# Patient Record
Sex: Male | Born: 1935 | Hispanic: No | Smoking: Never smoker
Health system: Southern US, Community
[De-identification: ages and names within clinical notes are randomized; demographics above are authoritative.]

## PROBLEM LIST (undated history)

## (undated) DIAGNOSIS — Z951 Presence of aortocoronary bypass graft: Secondary | ICD-10-CM

## (undated) DIAGNOSIS — N289 Disorder of kidney and ureter, unspecified: Secondary | ICD-10-CM

## (undated) DIAGNOSIS — I219 Acute myocardial infarction, unspecified: Secondary | ICD-10-CM

## (undated) DIAGNOSIS — E119 Type 2 diabetes mellitus without complications: Secondary | ICD-10-CM

## (undated) HISTORY — PX: APPENDECTOMY: SHX54

## (undated) HISTORY — PX: CORONARY ARTERY BYPASS GRAFT: SHX141

## (undated) HISTORY — PX: TOTAL HIP ARTHROPLASTY: SHX124

---

## 2016-01-28 ENCOUNTER — Encounter: Payer: Self-pay | Admitting: Oncology

## 2016-01-28 ENCOUNTER — Other Ambulatory Visit: Payer: Medicare Other

## 2016-02-21 ENCOUNTER — Telehealth: Payer: Self-pay | Admitting: Genetic Counselor

## 2016-02-21 NOTE — Telephone Encounter (Signed)
[name] gives his permission to mail his genetic test result from the family studies we ordered to his daughter.  I will mail him a copy as well.  Briefly discussed results and what they mean.  His daughter Colton Dunn had already called an talked to him about this information.  He knows he is always welcome to call with any questions.

## 2018-06-07 ENCOUNTER — Inpatient Hospital Stay (HOSPITAL_BASED_OUTPATIENT_CLINIC_OR_DEPARTMENT_OTHER)
Admission: EM | Admit: 2018-06-07 | Discharge: 2018-06-10 | DRG: 193 | Disposition: A | Discharge status: Home Health | Payer: Medicare Other | Attending: Internal Medicine | Admitting: Internal Medicine

## 2018-06-07 ENCOUNTER — Other Ambulatory Visit: Payer: Self-pay

## 2018-06-07 ENCOUNTER — Emergency Department (HOSPITAL_BASED_OUTPATIENT_CLINIC_OR_DEPARTMENT_OTHER): Payer: Medicare Other

## 2018-06-07 ENCOUNTER — Encounter (HOSPITAL_BASED_OUTPATIENT_CLINIC_OR_DEPARTMENT_OTHER): Payer: Self-pay

## 2018-06-07 DIAGNOSIS — R296 Repeated falls: Secondary | ICD-10-CM | POA: Diagnosis present

## 2018-06-07 DIAGNOSIS — Z96649 Presence of unspecified artificial hip joint: Secondary | ICD-10-CM | POA: Diagnosis present

## 2018-06-07 DIAGNOSIS — F419 Anxiety disorder, unspecified: Secondary | ICD-10-CM | POA: Diagnosis present

## 2018-06-07 DIAGNOSIS — I2510 Atherosclerotic heart disease of native coronary artery without angina pectoris: Secondary | ICD-10-CM | POA: Diagnosis not present

## 2018-06-07 DIAGNOSIS — Z7982 Long term (current) use of aspirin: Secondary | ICD-10-CM | POA: Diagnosis not present

## 2018-06-07 DIAGNOSIS — E119 Type 2 diabetes mellitus without complications: Secondary | ICD-10-CM | POA: Diagnosis present

## 2018-06-07 DIAGNOSIS — D509 Iron deficiency anemia, unspecified: Secondary | ICD-10-CM | POA: Diagnosis present

## 2018-06-07 DIAGNOSIS — W19XXXA Unspecified fall, initial encounter: Secondary | ICD-10-CM | POA: Diagnosis not present

## 2018-06-07 DIAGNOSIS — J9602 Acute respiratory failure with hypercapnia: Secondary | ICD-10-CM | POA: Diagnosis present

## 2018-06-07 DIAGNOSIS — E1159 Type 2 diabetes mellitus with other circulatory complications: Secondary | ICD-10-CM | POA: Diagnosis not present

## 2018-06-07 DIAGNOSIS — F329 Major depressive disorder, single episode, unspecified: Secondary | ICD-10-CM | POA: Diagnosis present

## 2018-06-07 DIAGNOSIS — Z23 Encounter for immunization: Secondary | ICD-10-CM

## 2018-06-07 DIAGNOSIS — Z951 Presence of aortocoronary bypass graft: Secondary | ICD-10-CM | POA: Diagnosis not present

## 2018-06-07 DIAGNOSIS — J189 Pneumonia, unspecified organism: Secondary | ICD-10-CM | POA: Diagnosis not present

## 2018-06-07 DIAGNOSIS — Z79899 Other long term (current) drug therapy: Secondary | ICD-10-CM | POA: Diagnosis not present

## 2018-06-07 DIAGNOSIS — I252 Old myocardial infarction: Secondary | ICD-10-CM | POA: Diagnosis not present

## 2018-06-07 DIAGNOSIS — Z794 Long term (current) use of insulin: Secondary | ICD-10-CM

## 2018-06-07 DIAGNOSIS — Z8249 Family history of ischemic heart disease and other diseases of the circulatory system: Secondary | ICD-10-CM

## 2018-06-07 DIAGNOSIS — I5032 Chronic diastolic (congestive) heart failure: Secondary | ICD-10-CM | POA: Diagnosis not present

## 2018-06-07 DIAGNOSIS — I25810 Atherosclerosis of coronary artery bypass graft(s) without angina pectoris: Secondary | ICD-10-CM | POA: Diagnosis not present

## 2018-06-07 DIAGNOSIS — J181 Lobar pneumonia, unspecified organism: Secondary | ICD-10-CM | POA: Diagnosis present

## 2018-06-07 DIAGNOSIS — E876 Hypokalemia: Secondary | ICD-10-CM | POA: Diagnosis present

## 2018-06-07 DIAGNOSIS — J9601 Acute respiratory failure with hypoxia: Secondary | ICD-10-CM | POA: Diagnosis not present

## 2018-06-07 DIAGNOSIS — Z87442 Personal history of urinary calculi: Secondary | ICD-10-CM

## 2018-06-07 HISTORY — DX: Acute myocardial infarction, unspecified: I21.9

## 2018-06-07 HISTORY — DX: Disorder of kidney and ureter, unspecified: N28.9

## 2018-06-07 HISTORY — DX: Type 2 diabetes mellitus without complications: E11.9

## 2018-06-07 HISTORY — DX: Presence of aortocoronary bypass graft: Z95.1

## 2018-06-07 LAB — HEPATIC FUNCTION PANEL
ALBUMIN: 3.8 g/dL (ref 3.5–5.0)
ALT: 20 U/L (ref 0–44)
AST: 29 U/L (ref 15–41)
Alkaline Phosphatase: 66 U/L (ref 38–126)
Bilirubin, Direct: 0.2 mg/dL (ref 0.0–0.2)
Indirect Bilirubin: 0.8 mg/dL (ref 0.3–0.9)
TOTAL PROTEIN: 7.7 g/dL (ref 6.5–8.1)
Total Bilirubin: 1 mg/dL (ref 0.3–1.2)

## 2018-06-07 LAB — BASIC METABOLIC PANEL
Anion gap: 10 (ref 5–15)
BUN: 29 mg/dL — ABNORMAL HIGH (ref 8–23)
CO2: 28 mmol/L (ref 22–32)
Calcium: 9.5 mg/dL (ref 8.9–10.3)
Chloride: 97 mmol/L — ABNORMAL LOW (ref 98–111)
Creatinine, Ser: 1.17 mg/dL (ref 0.61–1.24)
GFR calc Af Amer: >60 mL/min (ref >60)
GFR calc non Af Amer: 57 mL/min — ABNORMAL LOW (ref >60)
Glucose, Bld: 136 mg/dL — ABNORMAL HIGH (ref 70–99)
Potassium: 4 mmol/L (ref 3.5–5.1)
Sodium: 135 mmol/L (ref 135–145)

## 2018-06-07 LAB — CBC
HCT: 42.6 % (ref 39.0–52.0)
Hemoglobin: 13.1 g/dL (ref 13.0–17.0)
MCH: 29.4 pg (ref 26.0–34.0)
MCHC: 30.8 g/dL (ref 30.0–36.0)
MCV: 95.7 fL (ref 80.0–100.0)
NRBC: 0 % (ref 0.0–0.2)
Platelets: 206 10*3/uL (ref 150–400)
RBC: 4.45 MIL/uL (ref 4.22–5.81)
RDW: 12.9 % (ref 11.5–15.5)
WBC: 13.8 10*3/uL — ABNORMAL HIGH (ref 4.0–10.5)

## 2018-06-07 LAB — URINALYSIS, ROUTINE W REFLEX MICROSCOPIC
Bilirubin Urine: NEGATIVE
Glucose, UA: 250 mg/dL — AB
KETONES UR: NEGATIVE mg/dL
Leukocytes,Ua: NEGATIVE
Nitrite: NEGATIVE
PH: 6 (ref 5.0–8.0)
Protein, ur: NEGATIVE mg/dL
SPECIFIC GRAVITY, URINE: 1.01 (ref 1.005–1.030)

## 2018-06-07 LAB — LACTIC ACID, PLASMA: Lactic Acid, Venous: 2.1 mmol/L (ref 0.5–1.9)

## 2018-06-07 LAB — URINALYSIS, MICROSCOPIC (REFLEX): SQUAMOUS EPITHELIAL / LPF: NONE SEEN (ref 0–5)

## 2018-06-07 LAB — TROPONIN I: Troponin I: <0.03 ng/mL (ref <0.03)

## 2018-06-07 MED ORDER — AZITHROMYCIN 250 MG PO TABS
1000.0000 mg | ORAL_TABLET | Freq: Once | ORAL | Status: AC
  Administered 2018-06-07: 1000 mg via ORAL
  Filled 2018-06-07: qty 4

## 2018-06-07 MED ORDER — SODIUM CHLORIDE 0.9 % IV SOLN
1.0000 g | Freq: Once | INTRAVENOUS | Status: AC
  Administered 2018-06-07: 1 g via INTRAVENOUS
  Filled 2018-06-07: qty 10

## 2018-06-07 MED ORDER — SODIUM CHLORIDE 0.9 % IV BOLUS
1000.0000 mL | Freq: Once | INTRAVENOUS | Status: AC
  Administered 2018-06-07: 1000 mL via INTRAVENOUS

## 2018-06-07 MED ORDER — ERYTHROMYCIN 5 MG/GM OP OINT
TOPICAL_OINTMENT | Freq: Once | OPHTHALMIC | Status: AC
  Administered 2018-06-07: 23:00:00 via OPHTHALMIC
  Filled 2018-06-07: qty 3.5

## 2018-06-07 NOTE — ED Notes (Signed)
Attempted 106F coude cath- resistance felt. Unable to obtain urine.

## 2018-06-07 NOTE — ED Provider Notes (Signed)
[facility] EMERGENCY DEPARTMENT Provider Note   CSN: [id number] Arrival date & time: 06/07/18  1911    History   Chief Complaint Chief Complaint  Patient presents with  . Fall    HPI Colton Dunn is a 85 y.o. male.     85 yo M with a chief complaint of a fall.  Patient states that he has fallen 3 times this week because he is felt weak and unsteady.  He thinks is due to his cough congestion and fever that has had for about 6 or 7 days.  They called his family doctor today and retry to get an antibiotic called in and they wanted to see him in the office.  He then had a fall.  He denies any injury in the falls.  He denies chest pain.  Has had some shortness of breath and fatigue.  Denies abdominal pain vomiting diarrhea.  He does intermittent catheterizations and felt like his urine has been more cloudy than normal.  The history is provided by the patient.  Fall  This is a new problem. The current episode started 2 days ago. The problem occurs constantly. The problem has not changed since onset.Pertinent negatives include no chest pain, no abdominal pain, no headaches and no shortness of breath. Nothing aggravates the symptoms. Nothing relieves the symptoms. He has tried nothing for the symptoms. The treatment provided no relief.    Past Medical History:  Diagnosis Date  . AMI (acute myocardial infarction) (HCC)   . Diabetes mellitus without complication (HCC)   . Hx of CABG   . Renal disorder    kidney stones    Patient Active Problem List   Diagnosis Date Noted  . CAP (community acquired pneumonia) 06/07/2018    Past Surgical History:  Procedure Laterality Date  . APPENDECTOMY    . CORONARY ARTERY BYPASS GRAFT    . TOTAL HIP ARTHROPLASTY          Home Medications    Prior to Admission medications   Medication Sig Start Date End Date Taking? Authorizing Provider  atenolol (TENORMIN) 25 MG tablet Take 25 mg by mouth. 10/19/17  Yes [provider]  escitalopram (LEXAPRO) 5 MG tablet Take by mouth. 03/01/18  Yes [provider]  ferrous sulfate 325 (65 FE) MG tablet TAKE 1 TABLET BY MOUTH ONCE DAILY WITH  BREAKFAST 11/21/17  Yes [provider]  metFORMIN (GLUCOPHAGE) 1000 MG tablet TAKE 1 TABLET BY MOUTH  TWICE A DAY 07/30/17  Yes [provider]  nortriptyline (PAMELOR) 75 MG capsule TAKE 1 CAPSULE BY MOUTH  NIGHTLY 09/29/17  Yes [provider]  pravastatin (PRAVACHOL) 40 MG tablet TAKE 1 TABLET BY MOUTH  NIGHTLY 02/13/18  Yes [provider]  sitaGLIPtin (JANUVIA) 100 MG tablet TAKE 1 TABLET BY MOUTH ONCE DAILY 09/29/17  Yes [provider]  spironolactone (ALDACTONE) 25 MG tablet Take by mouth. 03/15/18  Yes [provider]  torsemide (DEMADEX) 20 MG tablet Take by mouth. 10/20/17  Yes [provider]  aspirin EC 81 MG tablet Take by mouth.    [provider]    Family History No family history on file.  Social History Social History   Tobacco Use  . Smoking status: Never Smoker  . Smokeless tobacco: Never Used  Substance Use Topics  . Alcohol use: Never    Frequency: Never  . Drug use: Never     Allergies   Patient has no known allergies.  Review of Systems Review of Systems  Constitutional: Negative for chills and fever.  HENT: Positive for congestion. Negative for facial swelling.   Eyes: Negative for discharge and visual disturbance.  Respiratory: Positive for cough. Negative for shortness of breath.   Cardiovascular: Negative for chest pain and palpitations.  Gastrointestinal: Negative for abdominal pain, diarrhea and vomiting.  Musculoskeletal: Negative for arthralgias and myalgias.  Skin: Negative for color change and rash.  Neurological: Positive for weakness (generalized). Negative for tremors, syncope and headaches.  Psychiatric/Behavioral: Negative for confusion and dysphoric mood.     Physical Exam Updated  Vital Signs BP (!) 143/57   Pulse 87   Temp 99.3 F (37.4 C) (Oral)   Resp (!) 26   Ht 5\' 11"  (1.803 m)   Wt 90.7 kg   SpO2 98%   BMI 27.89 kg/m   Physical Exam Vitals signs and nursing note reviewed.  Constitutional:      Appearance: He is well-developed.     Comments: Pallor, dry mucous membranes  HENT:     Head: Normocephalic and atraumatic.  Eyes:     Pupils: Pupils are equal, round, and reactive to light.  Neck:     Musculoskeletal: Normal range of motion and neck supple.     Vascular: No JVD.  Cardiovascular:     Rate and Rhythm: Normal rate and regular rhythm.     Heart sounds: No murmur. No friction rub. No gallop.   Pulmonary:     Effort: No respiratory distress.     Breath sounds: No wheezing.     Comments: Rhonchi to the right lower lobe.  Diminished breath sounds to the entire left chest wall. Abdominal:     General: There is no distension.     Tenderness: There is no guarding or rebound.  Musculoskeletal: Normal range of motion.  Skin:    Coloration: Skin is not pale.     Findings: No rash.  Neurological:     Mental Status: He is alert and oriented to person, place, and time.  Psychiatric:        Behavior: Behavior normal.      ED Treatments / Results  Labs (all labs ordered are listed, but only abnormal results are displayed) Labs Reviewed  URINALYSIS, ROUTINE W REFLEX MICROSCOPIC - Abnormal; Notable for the following components:      Result Value   Glucose, UA 250 (*)    Hgb urine dipstick TRACE (*)    All other components within normal limits  BASIC METABOLIC PANEL - Abnormal; Notable for the following components:   Chloride 97 (*)    Glucose, Bld 136 (*)    BUN 29 (*)    GFR calc non Af Amer 57 (*)    All other components within normal limits  CBC - Abnormal; Notable for the following components:   WBC 13.8 (*)    All other components within normal limits  LACTIC ACID, PLASMA - Abnormal; Notable for the following components:   Lactic  Acid, Venous 2.1 (*)    All other components within normal limits  URINALYSIS, MICROSCOPIC (REFLEX) - Abnormal; Notable for the following components:   Bacteria, UA FEW (*)    All other components within normal limits  URINE CULTURE  CULTURE, BLOOD (ROUTINE X 2)  CULTURE, BLOOD (ROUTINE X 2)  TROPONIN I  HEPATIC FUNCTION PANEL  LACTIC ACID, PLASMA    EKG EKG Interpretation  Date/Time:  Friday June 07 2018 19:47:14 EST Ventricular Rate:  81 PR  Interval:    QRS Duration: 112 QT Interval:  321 QTC Calculation: 373 R Axis:   44 Text Interpretation:  Wandering atrial pacemaker Probable left atrial enlargement Borderline intraventricular conduction delay Baseline wander in lead(s) V2 No old tracing to compare Confirmed by Melene Plan [phone]) on 06/07/2018 7:51:41 PM   Radiology Dg Chest 2 View  Result Date: 06/07/2018 CLINICAL DATA:  Productive cough, shortness of breath EXAM: CHEST - 2 VIEW COMPARISON:  None. FINDINGS: Prior CABG. Heart is normal size. Left base atelectasis or infiltrate. Patchy right upper lobe opacity for. No effusions or acute bony abnormality. IMPRESSION: Patchy opacities in the inferior right upper lobe and the left lung base. Cannot exclude early infiltrates/pneumonia. Electronically Signed   By: Charlett Nose M.D.   On: 06/07/2018 21:12    Procedures Procedures (including critical care time)  Medications Ordered in ED Medications  sodium chloride 0.9 % bolus 1,000 mL (1,000 mLs Intravenous New Bag/Given 06/07/18 1957)  cefTRIAXone (ROCEPHIN) 1 g in sodium chloride 0.9 % 100 mL IVPB (1 g Intravenous New Bag/Given 06/07/18 2142)  azithromycin (ZITHROMAX) tablet 1,000 mg (1,000 mg Oral Given 06/07/18 2139)     Initial Impression / Assessment and Plan / ED Course  I have reviewed the triage vital signs and the nursing notes.  Pertinent labs & imaging results that were available during my care of the patient were reviewed by me and considered in my medical  decision making (see chart for details).        85 yo M with a chief complaint of cough and congestion.  This been going on for the past week.  He has been excessively fatigued and has had multiple episodes where he has fallen to the ground because he felt like he lost his balance.  Having some trouble breathing as well.  Was noted to be hypoxic on arrival with an O2 sat in the mid 80s with very minimal exertion.  Improved with oxygen.  I suspect he likely has pneumonia, will obtain lab work chest x-ray urine lactate give a bolus of fluids and reassess.  Patient's lactic acid is mildly elevated at 2.1.  Urine is not infected.  He has a left lower lobe infiltrate on my view of the chest x-ray.  Will cover for community-acquired pneumonia.  As he is requiring oxygen and has not been doing well at home will discuss with admitting team.  The patients results and plan were reviewed and discussed.   Any x-rays performed were independently reviewed by myself.   Differential diagnosis were considered with the presenting HPI.  Medications  sodium chloride 0.9 % bolus 1,000 mL (1,000 mLs Intravenous New Bag/Given 06/07/18 1957)  cefTRIAXone (ROCEPHIN) 1 g in sodium chloride 0.9 % 100 mL IVPB (1 g Intravenous New Bag/Given 06/07/18 2142)  azithromycin (ZITHROMAX) tablet 1,000 mg (1,000 mg Oral Given 06/07/18 2139)    Vitals:   06/07/18 1926 06/07/18 1927 06/07/18 2145  BP: (!) 152/81  (!) 143/57  Pulse: 87  87  Resp: (!) 28  (!) 26  Temp: 99.3 F (37.4 C)    TempSrc: Oral    SpO2: 90%  98%  Weight:  90.7 kg   Height:   (1.803 m)     Final diagnoses:  Community acquired pneumonia of left lower lobe of lung (HCC)    Admission/ observation were discussed with the admitting physician, patient and/or family and they are comfortable with the plan.    Final Clinical Impressions(s) / ED  Diagnoses   Final diagnoses:  Community acquired pneumonia of left lower lobe of lung [facility])    ED  Discharge Orders    None       Melene Plan, DO 06/07/18 2216

## 2018-06-07 NOTE — ED Notes (Signed)
[facility] arrived to transfer pt to [facility] 

## 2018-06-07 NOTE — ED Triage Notes (Signed)
Pt reports a fall today. Pt denies injury from the fall but is concerned because this is the 3rd fall this week. Pt also productive cough. Pt reports a temp TMax of 100. Pt reports cloudy urine when he straight cath himself this AM.

## 2018-06-07 NOTE — ED Notes (Signed)
Not ready, staff @bedside 

## 2018-06-07 NOTE — ED Notes (Signed)
Attempted in/out 63F cath- resistance felt, unable to obtain urine and blood noted when pulled out.

## 2018-06-07 NOTE — ED Notes (Signed)
Patient transported to X-ray 

## 2018-06-07 NOTE — ED Notes (Signed)
MD bedside

## 2018-06-07 NOTE — ED Notes (Signed)
Pt did self cath with 63F. Urine obtained. 700cc output.

## 2018-06-08 ENCOUNTER — Inpatient Hospital Stay (HOSPITAL_COMMUNITY): Payer: Medicare Other

## 2018-06-08 ENCOUNTER — Encounter (HOSPITAL_COMMUNITY): Payer: Self-pay | Admitting: Internal Medicine

## 2018-06-08 DIAGNOSIS — E1159 Type 2 diabetes mellitus with other circulatory complications: Secondary | ICD-10-CM

## 2018-06-08 DIAGNOSIS — I2510 Atherosclerotic heart disease of native coronary artery without angina pectoris: Secondary | ICD-10-CM | POA: Diagnosis present

## 2018-06-08 DIAGNOSIS — J9601 Acute respiratory failure with hypoxia: Secondary | ICD-10-CM | POA: Diagnosis present

## 2018-06-08 DIAGNOSIS — J9602 Acute respiratory failure with hypercapnia: Secondary | ICD-10-CM

## 2018-06-08 DIAGNOSIS — W19XXXA Unspecified fall, initial encounter: Secondary | ICD-10-CM | POA: Diagnosis present

## 2018-06-08 DIAGNOSIS — J189 Pneumonia, unspecified organism: Principal | ICD-10-CM

## 2018-06-08 DIAGNOSIS — J181 Lobar pneumonia, unspecified organism: Secondary | ICD-10-CM

## 2018-06-08 LAB — LACTIC ACID, PLASMA: Lactic Acid, Venous: 0.8 mmol/L (ref 0.5–1.9)

## 2018-06-08 LAB — CBC WITH DIFFERENTIAL/PLATELET
Abs Immature Granulocytes: 0.1 10*3/uL — ABNORMAL HIGH (ref 0.00–0.07)
Basophils Absolute: 0 10*3/uL (ref 0.0–0.1)
Basophils Relative: 0 %
Eosinophils Absolute: 0.1 10*3/uL (ref 0.0–0.5)
Eosinophils Relative: 1 %
HEMATOCRIT: 37.2 % — AB (ref 39.0–52.0)
Hemoglobin: 11.4 g/dL — ABNORMAL LOW (ref 13.0–17.0)
Immature Granulocytes: 1 %
Lymphocytes Relative: 11 %
Lymphs Abs: 1.4 10*3/uL (ref 0.7–4.0)
MCH: 29.8 pg (ref 26.0–34.0)
MCHC: 30.6 g/dL (ref 30.0–36.0)
MCV: 97.4 fL (ref 80.0–100.0)
Monocytes Absolute: 2 10*3/uL — ABNORMAL HIGH (ref 0.1–1.0)
Monocytes Relative: 15 %
NEUTROS ABS: 9.5 10*3/uL — AB (ref 1.7–7.7)
Neutrophils Relative %: 72 %
Platelets: 188 10*3/uL (ref 150–400)
RBC: 3.82 MIL/uL — AB (ref 4.22–5.81)
RDW: 13.1 % (ref 11.5–15.5)
WBC: 13.1 10*3/uL — ABNORMAL HIGH (ref 4.0–10.5)
nRBC: 0 % (ref 0.0–0.2)

## 2018-06-08 LAB — GLUCOSE, CAPILLARY
GLUCOSE-CAPILLARY: 249 mg/dL — AB (ref 70–99)
Glucose-Capillary: 147 mg/dL — ABNORMAL HIGH (ref 70–99)
Glucose-Capillary: 161 mg/dL — ABNORMAL HIGH (ref 70–99)
Glucose-Capillary: 237 mg/dL — ABNORMAL HIGH (ref 70–99)
Glucose-Capillary: 44 mg/dL — CL (ref 70–99)
Glucose-Capillary: 49 mg/dL — ABNORMAL LOW (ref 70–99)
Glucose-Capillary: 61 mg/dL — ABNORMAL LOW (ref 70–99)
Glucose-Capillary: 79 mg/dL (ref 70–99)

## 2018-06-08 LAB — EXPECTORATED SPUTUM ASSESSMENT W GRAM STAIN, RFLX TO RESP C

## 2018-06-08 LAB — RESPIRATORY PANEL BY PCR
Adenovirus: NOT DETECTED
Bordetella pertussis: NOT DETECTED
CORONAVIRUS NL63-RVPPCR: NOT DETECTED
Chlamydophila pneumoniae: NOT DETECTED
Coronavirus 229E: NOT DETECTED
Coronavirus HKU1: NOT DETECTED
Coronavirus OC43: NOT DETECTED
INFLUENZA A-RVPPCR: NOT DETECTED
INFLUENZA B-RVPPCR: NOT DETECTED
Metapneumovirus: NOT DETECTED
Mycoplasma pneumoniae: NOT DETECTED
PARAINFLUENZA VIRUS 4-RVPPCR: NOT DETECTED
Parainfluenza Virus 1: NOT DETECTED
Parainfluenza Virus 2: NOT DETECTED
Parainfluenza Virus 3: NOT DETECTED
Respiratory Syncytial Virus: NOT DETECTED
Rhinovirus / Enterovirus: NOT DETECTED

## 2018-06-08 LAB — COMPREHENSIVE METABOLIC PANEL
ALT: 18 U/L (ref 0–44)
AST: 23 U/L (ref 15–41)
Albumin: 3.3 g/dL — ABNORMAL LOW (ref 3.5–5.0)
Alkaline Phosphatase: 57 U/L (ref 38–126)
Anion gap: 9 (ref 5–15)
BUN: 24 mg/dL — ABNORMAL HIGH (ref 8–23)
CO2: 27 mmol/L (ref 22–32)
Calcium: 8.8 mg/dL — ABNORMAL LOW (ref 8.9–10.3)
Chloride: 101 mmol/L (ref 98–111)
Creatinine, Ser: 0.98 mg/dL (ref 0.61–1.24)
GFR calc non Af Amer: >60 mL/min (ref >60)
Glucose, Bld: 244 mg/dL — ABNORMAL HIGH (ref 70–99)
POTASSIUM: 3.9 mmol/L (ref 3.5–5.1)
Sodium: 137 mmol/L (ref 135–145)
Total Bilirubin: 0.8 mg/dL (ref 0.3–1.2)
Total Protein: 6.8 g/dL (ref 6.5–8.1)

## 2018-06-08 LAB — HEMOGLOBIN A1C
Hgb A1c MFr Bld: 7.1 % — ABNORMAL HIGH (ref 4.8–5.6)
Mean Plasma Glucose: 157.07 mg/dL

## 2018-06-08 LAB — EXPECTORATED SPUTUM ASSESSMENT W REFEX TO RESP CULTURE

## 2018-06-08 LAB — STREP PNEUMONIAE URINARY ANTIGEN: Strep Pneumo Urinary Antigen: NEGATIVE

## 2018-06-08 LAB — HIV ANTIBODY (ROUTINE TESTING W REFLEX): HIV Screen 4th Generation wRfx: NONREACTIVE

## 2018-06-08 LAB — MAGNESIUM: MAGNESIUM: 1.5 mg/dL — AB (ref 1.7–2.4)

## 2018-06-08 LAB — TROPONIN I: Troponin I: 0.03 ng/mL (ref <0.03)

## 2018-06-08 LAB — TSH: TSH: 2.205 u[IU]/mL (ref 0.350–4.500)

## 2018-06-08 MED ORDER — ACETAMINOPHEN 650 MG RE SUPP: 650.0000 mg | Freq: Four times a day (QID) | RECTAL | Status: DC | PRN

## 2018-06-08 MED ORDER — ESCITALOPRAM OXALATE 10 MG PO TABS
5.0000 mg | ORAL_TABLET | Freq: Every day | ORAL | Status: DC
  Administered 2018-06-08 – 2018-06-10 (×3): 5 mg via ORAL
  Filled 2018-06-08 (×3): qty 1

## 2018-06-08 MED ORDER — INSULIN ASPART 100 UNIT/ML ~~LOC~~ SOLN
0.0000 [IU] | Freq: Three times a day (TID) | SUBCUTANEOUS | Status: DC
  Administered 2018-06-08 – 2018-06-09 (×3): 3 [IU] via SUBCUTANEOUS
  Administered 2018-06-09: 2 [IU] via SUBCUTANEOUS
  Administered 2018-06-10: 3 [IU] via SUBCUTANEOUS

## 2018-06-08 MED ORDER — CHLORHEXIDINE GLUCONATE 0.12 % MT SOLN
15.0000 mL | Freq: Two times a day (BID) | OROMUCOSAL | Status: DC
  Administered 2018-06-08 – 2018-06-09 (×5): 15 mL via OROMUCOSAL
  Filled 2018-06-08 (×4): qty 15

## 2018-06-08 MED ORDER — ACETAMINOPHEN 325 MG PO TABS: 650.0000 mg | ORAL_TABLET | Freq: Four times a day (QID) | ORAL | Status: DC | PRN

## 2018-06-08 MED ORDER — PNEUMOCOCCAL VAC POLYVALENT 25 MCG/0.5ML IJ INJ
0.5000 mL | INJECTION | INTRAMUSCULAR | Status: AC
  Administered 2018-06-09: 0.5 mL via INTRAMUSCULAR
  Filled 2018-06-08: qty 0.5

## 2018-06-08 MED ORDER — FERROUS SULFATE 325 (65 FE) MG PO TABS
325.0000 mg | ORAL_TABLET | Freq: Every day | ORAL | Status: DC
  Administered 2018-06-08 – 2018-06-10 (×3): 325 mg via ORAL
  Filled 2018-06-08 (×3): qty 1

## 2018-06-08 MED ORDER — NITROGLYCERIN 0.4 MG SL SUBL: 0.4000 mg | SUBLINGUAL_TABLET | SUBLINGUAL | Status: DC

## 2018-06-08 MED ORDER — ATENOLOL 25 MG PO TABS
25.0000 mg | ORAL_TABLET | Freq: Every day | ORAL | Status: DC
  Administered 2018-06-08 – 2018-06-10 (×3): 25 mg via ORAL
  Filled 2018-06-08 (×3): qty 1

## 2018-06-08 MED ORDER — INSULIN DETEMIR 100 UNIT/ML ~~LOC~~ SOLN
22.0000 [IU] | Freq: Two times a day (BID) | SUBCUTANEOUS | Status: DC
  Administered 2018-06-08: 22 [IU] via SUBCUTANEOUS
  Filled 2018-06-08 (×2): qty 0.22

## 2018-06-08 MED ORDER — NORTRIPTYLINE HCL 25 MG PO CAPS
75.0000 mg | ORAL_CAPSULE | Freq: Every day | ORAL | Status: DC
  Administered 2018-06-08 – 2018-06-09 (×2): 75 mg via ORAL
  Filled 2018-06-08 (×2): qty 3

## 2018-06-08 MED ORDER — NITROGLYCERIN 0.4 MG SL SUBL: 0.4000 mg | SUBLINGUAL_TABLET | SUBLINGUAL | Status: DC | PRN

## 2018-06-08 MED ORDER — INSULIN ASPART 100 UNIT/ML ~~LOC~~ SOLN
20.0000 [IU] | Freq: Three times a day (TID) | SUBCUTANEOUS | Status: DC
  Administered 2018-06-08 (×2): 20 [IU] via SUBCUTANEOUS

## 2018-06-08 MED ORDER — SODIUM CHLORIDE 0.9 % IV SOLN
500.0000 mg | INTRAVENOUS | Status: DC
  Administered 2018-06-08 – 2018-06-09 (×2): 500 mg via INTRAVENOUS
  Filled 2018-06-08 (×3): qty 500

## 2018-06-08 MED ORDER — ONDANSETRON HCL 4 MG/2ML IJ SOLN: 4.0000 mg | Freq: Four times a day (QID) | INTRAMUSCULAR | Status: DC | PRN

## 2018-06-08 MED ORDER — DEXTROSE-NACL 5-0.45 % IV SOLN
INTRAVENOUS | Status: DC
  Administered 2018-06-08 – 2018-06-09 (×2): via INTRAVENOUS

## 2018-06-08 MED ORDER — ORAL CARE MOUTH RINSE
15.0000 mL | Freq: Two times a day (BID) | OROMUCOSAL | Status: DC
  Administered 2018-06-08 – 2018-06-09 (×4): 15 mL via OROMUCOSAL

## 2018-06-08 MED ORDER — TORSEMIDE 20 MG PO TABS
20.0000 mg | ORAL_TABLET | ORAL | Status: DC
  Administered 2018-06-08 – 2018-06-10 (×2): 20 mg via ORAL
  Filled 2018-06-08 (×2): qty 1

## 2018-06-08 MED ORDER — INSULIN DETEMIR 100 UNIT/ML ~~LOC~~ SOLN
15.0000 [IU] | Freq: Two times a day (BID) | SUBCUTANEOUS | Status: DC
  Administered 2018-06-08 – 2018-06-09 (×3): 15 [IU] via SUBCUTANEOUS
  Filled 2018-06-08 (×4): qty 0.15

## 2018-06-08 MED ORDER — PRAVASTATIN SODIUM 40 MG PO TABS
40.0000 mg | ORAL_TABLET | Freq: Every day | ORAL | Status: DC
  Administered 2018-06-08 – 2018-06-09 (×2): 40 mg via ORAL
  Filled 2018-06-08 (×2): qty 1

## 2018-06-08 MED ORDER — ONDANSETRON HCL 4 MG PO TABS: 4.0000 mg | ORAL_TABLET | Freq: Four times a day (QID) | ORAL | Status: DC | PRN

## 2018-06-08 MED ORDER — ERYTHROMYCIN 5 MG/GM OP OINT
TOPICAL_OINTMENT | Freq: Three times a day (TID) | OPHTHALMIC | Status: DC
  Administered 2018-06-08 (×2): via OPHTHALMIC
  Administered 2018-06-08 – 2018-06-09 (×3): 1 via OPHTHALMIC
  Administered 2018-06-09: 06:00:00 via OPHTHALMIC
  Administered 2018-06-10: 1 via OPHTHALMIC
  Administered 2018-06-10: 15:00:00 via OPHTHALMIC
  Filled 2018-06-08: qty 1

## 2018-06-08 MED ORDER — SPIRONOLACTONE 25 MG PO TABS
25.0000 mg | ORAL_TABLET | ORAL | Status: DC
  Administered 2018-06-08 – 2018-06-10 (×2): 25 mg via ORAL
  Filled 2018-06-08 (×3): qty 1

## 2018-06-08 MED ORDER — SODIUM CHLORIDE 0.9 % IV SOLN
1.0000 g | INTRAVENOUS | Status: DC
  Administered 2018-06-08 – 2018-06-09 (×2): 1 g via INTRAVENOUS
  Filled 2018-06-08: qty 10
  Filled 2018-06-08 (×2): qty 1

## 2018-06-08 MED ORDER — MAGNESIUM SULFATE 2 GM/50ML IV SOLN
2.0000 g | Freq: Once | INTRAVENOUS | Status: AC
  Administered 2018-06-08: 2 g via INTRAVENOUS
  Filled 2018-06-08: qty 50

## 2018-06-08 MED ORDER — ASPIRIN EC 81 MG PO TBEC
81.0000 mg | DELAYED_RELEASE_TABLET | Freq: Every day | ORAL | Status: DC
  Administered 2018-06-08 – 2018-06-10 (×3): 81 mg via ORAL
  Filled 2018-06-08 (×3): qty 1

## 2018-06-08 MED ORDER — DEXTROSE 50 % IV SOLN
1.0000 | Freq: Once | INTRAVENOUS | Status: DC
  Filled 2018-06-08: qty 50

## 2018-06-08 NOTE — Progress Notes (Signed)
CRITICAL VALUE ALERT  Critical Value:  Troponin 0.03  Date & Time Notified: 06/08/2018 0540  Provider Notified: Rana Snare 0542  Orders Received/Actions taken:  Lenox Ponds, RN

## 2018-06-08 NOTE — Evaluation (Signed)
Physical Therapy Evaluation Patient Details Name: Colton Dunn MRN: [id number] DOB: 1932/10/16 Today's Date: 06/08/2018   History of Present Illness  85 y.o. male with history of CAD status post CABG, diabetes mellitus, diastolic dysfunction, hyperlipidemia admitted from [facility] with  wheezing cough weakness and had a 3 falls in the last 1 week, Dx with CAP  Clinical Impression  Pt admitted with above diagnosis. Pt currently with functional limitations due to the deficits listed below (see PT Problem List). Pt doing well, feeling better today; may benefit from using RW for safety initially; continue HHPT post acute;   Pt will benefit from skilled PT to increase their independence and safety with mobility to allow discharge to the venue listed below.       Follow Up Recommendations Home health PT    Equipment Recommendations  None recommended by PT    Recommendations for Other Services       Precautions / Restrictions Precautions Precautions: Fall      Mobility  Bed Mobility               General bed mobility comments: pt in chair on arrival  Transfers Overall transfer level: Needs assistance Equipment used: Rolling walker (2 wheeled) Transfers: Sit to/from Stand Sit to Stand: Min guard         General transfer comment: for safety  Ambulation/Gait Ambulation/Gait assistance: Min guard;Supervision Gait Distance (Feet): 200 Feet Assistive device: Rolling walker (2 wheeled) Gait Pattern/deviations: Step-to pattern;Step-through pattern;Decreased stride length     General Gait Details: cues for RW safety and trunk extension  Stairs            Wheelchair Mobility    Modified Rankin (Stroke Patients Only)       Balance Overall balance assessment: Needs assistance;History of Falls(reports 3 falls in the last week since being sick)   Sitting balance-Leahy Scale: Good       Standing balance-Leahy Scale: Good(fair to good, NT to mod  challenges) Standing balance comment: pt able to wt shift without UE support and begin self cath in standing--RN present upon PT departure from pt for safety in bathroom                             Pertinent Vitals/Pain Pain Assessment: No/denies pain    Home Living Family/patient expects to be discharged to:: Private residence Living Arrangements: Children Available Help at Discharge: Family Type of Home: House         Home Equipment: Dan Humphreys - 2 wheels Additional Comments: pt has been living with his dtr since Jan 2020, he plans to return to her home or His son's home in [place]    Prior Function Level of Independence: Independent         Comments: reports IND with ADLs, amb without AD      Hand Dominance        Extremity/Trunk Assessment   Upper Extremity Assessment Upper Extremity Assessment: Overall WFL for tasks assessed    Lower Extremity Assessment Lower Extremity Assessment: Generalized weakness       Communication   Communication: No difficulties  Cognition Arousal/Alertness: Awake/alert Behavior During Therapy: WFL for tasks assessed/performed Overall Cognitive Status: Within Functional Limits for tasks assessed  General Comments      Exercises     Assessment/Plan    PT Assessment Patient needs continued PT services  PT Problem List Decreased activity tolerance;Decreased strength;Decreased mobility;Decreased knowledge of use of DME;Decreased balance       PT Treatment Interventions DME instruction;Gait training;Functional mobility training;Therapeutic activities;Patient/family education;Therapeutic exercise    PT Goals (Current goals can be found in the Care Plan section)  Acute Rehab PT Goals Patient Stated Goal: home soon, back to doing therapy PT Goal Formulation: With patient Time For Goal Achievement: 06/22/18 Potential to Achieve Goals: Good    Frequency Min  3X/week   Barriers to discharge        Co-evaluation               AM-PAC PT "6 Clicks" Mobility  Outcome Measure Help needed turning from your back to your side while in a flat bed without using bedrails?: A Little Help needed moving from lying on your back to sitting on the side of a flat bed without using bedrails?: A Little Help needed moving to and from a bed to a chair (including a wheelchair)?: A Little Help needed standing up from a chair using your arms (e.g., wheelchair or bedside chair)?: A Little Help needed to walk in hospital room?: A Little Help needed climbing 3-5 steps with a railing? : A Little 6 Click Score: 18    End of Session Equipment Utilized During Treatment: Gait belt Activity Tolerance: Patient tolerated treatment well Patient left: with call bell/phone within reach;Other (comment)   PT Visit Diagnosis: Unsteadiness on feet (R26.81)    Time: 9191-6606 PT Time Calculation (min) (ACUTE ONLY): 13 min   Charges:   PT Evaluation $PT Eval Low Complexity: 1 Low          Drucilla Chalet, PT  Pager: [phone] Acute Rehab Dept [facility]): [phone]   06/08/2018   [facility] 06/08/2018, 1:17 PM

## 2018-06-08 NOTE — Progress Notes (Signed)
PROGRESS NOTE    Colton KannerWillie Pennix  AVW:[id number] DOB: [birth year] DOA: 06/07/2018 PCP: Sherrilyn RistFakadej, Maria Margaret, MD   Chief complaint: Shortness of breath, cough, fall   Brief Narrative:   Colton Dunn is a 85 y.o. male with history of CAD status post CABG, diabetes mellitus, diastolic dysfunction, hyperlipidemia presents to the ER admits in [facility] with complaints of having wheezing cough weakness and had a 3 falls in the last 1 week.  Patient states since Monday, June 03, 2018 patient started having wheezing and coughing productive of sputum.  Since then patient started feeling weak and had 3 falls did not lose consciousness.  Has no chest pain.  Since patient weakness and cough persisted patient came to the ER at [facility].  ED Course: EKG was showing wandering pacemaker.  Labs showed leukocytosis of 13.8 lactate is mildly elevated.  Was given fluid bolus.  Chest x-ray shows infiltrates concerning for pneumonia and patient's clinical picture also fits with it.  Patient admitted for pneumonia and also will need further observation for his recurrent falls.  Patient was started on ceftriaxone and Zithromax.   Assessment & Plan:   Principal Problem:   CAP (community acquired pneumonia) Active Problems:   Falls   CAD (coronary artery disease)   Type 2 diabetes mellitus with vascular disease (HCC)   Acute hypoxic respiratory failure Pneumonia, suspect gram-negative organism Patient presenting with shortness of breath, productive cough.  Noted to have an elevated white blood cell count on admission of 13.1 with a lactic acid of 2.1.  Chest x-ray notable for patchy infiltrates right upper lobe and left lung base.  Requiring supplemental oxygen to maintain adequate SPO2.  Not on home O2. --Respiratory viral panel, urine Legionella/strep antigen, blood cx x2, sputum cx, urine cx pending --Continue antibiotics with azithromycin and ceftriaxone --Continue to titrate  supplemental oxygen to maintain SPO2 greater than 92% --Supportive care  Recurrent falls Patient currently resides alone.  Reports 3 falls without syncope over the past week.  He plans to eventually move in with either his son or daughter.  CT head unremarkable. --Continue to monitor on telemetry --Orthostatics pending --PT/OT evaluation --May require placement if unable to move in with son/daughter following this hospitalization  CAD s/p CABG Chronic congestive heart failure with diastolic dysfunction Previous echocardiogram October 2019 notable for EF of 55-60% with diastolic dysfunction. --Continue atenolol 25 mg p.o. daily, spironolactone 25 mg QOD, torsemide 20 mg QOD --Continue statin and aspirin  Type 2 diabetes mellitus Home regimen includes sitagliptin 100 mg PO QOD, metformin 1000 mg, Levemir 22u [place] BID, Humalog 26u TIDAC.  Last hemoglobin A1c 6.3 on 02/22/2018. --Repeat hemoglobin A1c --Hold oral hypoglycemics --Continue Levemir 22 units subcutaneously twice daily --Reduce Humalog to 20 units 3 times daily AC with insulin sliding scale for further coverage --To monitor glucose closely especially in the setting of active infection.  Anxiety/depression --Continue home Lexapro 5 mg p.o. daily --Continue home nortriptyline 75 mg qHS  Iron deficiency anemia --Continue home ferrous sulfate 325 mg p.o. daily  DVT prophylaxis: SCDs Code Status: Full code Family Communication: None Disposition Plan: Continue inpatient hospitalization, need to further titrate supplemental oxygen, PT evaluation for discharge needs, further depending on clinical course.   Consultants:   None  Procedures:   None  Antimicrobials:   Azithromycin 2/29>>>  Ceftriaxone 2/29>>>   Subjective: Patient seen and examined at bedside, resting comfortably in bed.  Continues with productive cough and significant shortness of breath.  Reports multiple  falls over the last week, denies any loss of  consciousness/syncope.  Lives alone, possible plan to move with his son or daughter in the future.  No other complaints at this time.  Denies headache, no fever/chills/night sweats, no nausea/vomiting/diarrhea, no chest pain, no palpitations, no abdominal pain, no congestion, no sore throat, no issues with bowel/bladder function, no paresthesias.  No acute events overnight per nursing staff.  Objective: Vitals:   06/08/18 0001 06/08/18 0008 06/08/18 0445 06/08/18 0614  BP:  (!) 154/65  133/61  Pulse:  95  100  Resp:  (!) 24  18  Temp: (!) 97.3 F (36.3 C) (!) 97.3 F (36.3 C)  98 F (36.7 C)  TempSrc: Oral Oral  Oral  SpO2:  95%  93%  Weight: 85.6 kg  86.3 kg   Height: 5\' 11"  (1.803 m)       Intake/Output Summary (Last 24 hours) at 06/08/2018 0940 Last data filed at 06/08/2018 0400 Gross per 24 hour  Intake 1100 ml  Output 625 ml  Net 475 ml   Filed Weights   06/07/18 1927 06/08/18 0001 06/08/18 0445  Weight: 90.7 kg 85.6 kg 86.3 kg    Examination:  General exam: Appears calm and comfortable  Respiratory system: Coarse breath sounds bilaterally with mild rhonchi left base, normal respiratory effort, on 2 L nasal cannula (not on home O2) Cardiovascular system: S1 & S2 heard, RRR. No JVD, murmurs, rubs, gallops or clicks. No pedal edema. Gastrointestinal system: Abdomen is nondistended, soft and nontender. No organomegaly or masses felt. Normal bowel sounds heard. Central nervous system: Alert and oriented. No focal neurological deficits. Extremities: Symmetric 5 x 5 power. Skin: No rashes, lesions or ulcers Psychiatry: Judgement and insight appear normal. Mood & affect appropriate.     Data Reviewed: I have personally reviewed following labs and imaging studies  CBC: Recent Labs  Lab 06/07/18 1947 06/08/18 0307  WBC 13.8* 13.1*  NEUTROABS  --  9.5*  HGB 13.1 11.4*  HCT 42.6 37.2*  MCV 95.7 97.4  PLT 206 188   Basic Metabolic Panel: Recent Labs  Lab  06/07/18 1947 06/08/18 0307  NA 135 137  K 4.0 3.9  CL 97* 101  CO2 28 27  GLUCOSE 136* 244*  BUN 29* 24*  CREATININE 1.17 0.98  CALCIUM 9.5 8.8*  MG  --  1.5*   GFR: Estimated Creatinine Clearance: 60.8 mL/min (by C-G formula based on SCr of 0.98 mg/dL). Liver Function Tests: Recent Labs  Lab 06/07/18 1947 06/08/18 0307  AST 29 23  ALT 20 18  ALKPHOS 66 57  BILITOT 1.0 0.8  PROT 7.7 6.8  ALBUMIN 3.8 3.3*   No results for input(s): LIPASE, AMYLASE in the last 168 hours. No results for input(s): AMMONIA in the last 168 hours. Coagulation Profile: No results for input(s): INR, PROTIME in the last 168 hours. Cardiac Enzymes: Recent Labs  Lab 06/07/18 1947 06/08/18 0307  TROPONINI <0.03 0.03*   BNP (last 3 results) No results for input(s): PROBNP in the last 8760 hours. HbA1C: No results for input(s): HGBA1C in the last 72 hours. CBG: Recent Labs  Lab 06/08/18 0018 06/08/18 0811  GLUCAP 161* 237*   Lipid Profile: No results for input(s): CHOL, HDL, LDLCALC, TRIG, CHOLHDL, LDLDIRECT in the last 72 hours. Thyroid Function Tests: Recent Labs    06/08/18 0307  TSH 2.205   Anemia Panel: No results for input(s): VITAMINB12, FOLATE, FERRITIN, TIBC, IRON, RETICCTPCT in the last 72 hours. Sepsis Labs:  Recent Labs  Lab 06/07/18 1947 06/08/18 0718  LATICACIDVEN 2.1* 0.8    No results found for this or any previous visit (from the past 240 hour(s)).       Radiology Studies: Dg Chest 2 View  Result Date: 06/07/2018 CLINICAL DATA:  Productive cough, shortness of breath EXAM: CHEST - 2 VIEW COMPARISON:  None. FINDINGS: Prior CABG. Heart is normal size. Left base atelectasis or infiltrate. Patchy right upper lobe opacity for. No effusions or acute bony abnormality. IMPRESSION: Patchy opacities in the inferior right upper lobe and the left lung base. Cannot exclude early infiltrates/pneumonia. Electronically Signed   By: Charlett Nose M.D.   On: 06/07/2018 21:12    Ct Head Wo Contrast  Result Date: 06/08/2018 CLINICAL DATA:  Initial evaluation for acute altered mental status. EXAM: CT HEAD WITHOUT CONTRAST TECHNIQUE: Contiguous axial images were obtained from the base of the skull through the vertex without intravenous contrast. COMPARISON:  None. FINDINGS: Brain: Examination degraded by motion artifact. Age-related cerebral atrophy with mild chronic small vessel ischemic disease. No acute intracranial hemorrhage. No acute large vessel territory infarct. No mass lesion, midline shift or mass effect. No hydrocephalus. No extra-axial fluid collection. Vascular: No hyperdense vessel. Scattered vascular calcifications noted within the carotid siphons. Skull: Scalp soft tissues demonstrate no acute finding. Calvarium intact. Sinuses/Orbits: Globes and orbital soft tissues within normal limits. Mucosal thickening present within the ethmoidal air cells and maxillary sinuses, chronic in appearance. No mastoid effusion. Other: None. IMPRESSION: 1. No acute intracranial abnormality. 2. Age-related cerebral atrophy with mild chronic small vessel ischemic disease. Electronically Signed   By: Rise Mu M.D.   On: 06/08/2018 04:51        Scheduled Meds: . aspirin EC  81 mg Oral Daily  . atenolol  25 mg Oral Daily  . chlorhexidine  15 mL Mouth Rinse BID  . erythromycin   Right Eye Q8H  . escitalopram  5 mg Oral Daily  . ferrous sulfate  325 mg Oral Q breakfast  . insulin aspart  0-9 Units Subcutaneous TID WC  . insulin aspart  20 Units Subcutaneous TID WC  . insulin detemir  22 Units Subcutaneous BID  . mouth rinse  15 mL Mouth Rinse q12n4p  . nortriptyline  75 mg Oral QHS  . [START ON 06/09/2018] pneumococcal 23 valent vaccine  0.5 mL Intramuscular Tomorrow-1000  . pravastatin  40 mg Oral q1800  . spironolactone  25 mg Oral QODAY  . torsemide  20 mg Oral QODAY   Continuous Infusions: . azithromycin    . cefTRIAXone (ROCEPHIN)  IV       LOS: 1 day     Time spent: 31 minutes    Alvira Philips [place], DO [facility] Pager [phone]  If 7PM-7AM, please contact night-coverage [email or web address] Password [facility] 06/08/2018, 9:40 AM

## 2018-06-08 NOTE — Progress Notes (Signed)
Pt. Alert and oriented x 4, no diaphoresis noted, weakness,  pt. states he feels like his blood sugar is low. CBG check=44. Juice given and rechecked at 1545 CBG=49. Pt. talking, remains alert and oriented x 4. Coke Cola given. MD notified and new orders received.  At 1611:  CBG = 61,  At 1636: CBG=79. MD notified and Dextrose 50% not given and pt. started on IV fluids per orders. Family at bedside and dinner served.

## 2018-06-08 NOTE — H&P (Signed)
History and Physical    Colton Dunn HMC:[id number] DOB: September 24, 1932 DOA: 06/07/2018  PCP: Sherrilyn Rist, MD  Patient coming from: Home.  Chief Complaint: Shortness of breath cough and fall.  HPI: Colton Dunn is a 85 y.o. male with history of CAD status post CABG, diabetes mellitus, diastolic dysfunction, hyperlipidemia presents to the ER admits in [facility] with complaints of having wheezing cough weakness and had a 3 falls in the last 1 week.  Patient states since Monday, June 03, 2018 patient started having wheezing and coughing productive of sputum.  Since then patient started feeling weak and had 3 falls did not lose consciousness.  Has no chest pain.  Since patient weakness and cough persisted patient came to the ER at [facility].  ED Course: EKG was showing wandering pacemaker.  Labs showed leukocytosis of 13.8 lactate is mildly elevated.  Was given fluid bolus.  Chest x-ray shows infiltrates concerning for pneumonia and patient's clinical picture also fits with it.  Patient admitted for pneumonia and also will need further observation for his recurrent falls.  Patient was started on ceftriaxone and Zithromax.  Review of Systems: As per HPI, rest all negative.   Past Medical History:  Diagnosis Date  . AMI (acute myocardial infarction) (HCC)   . Diabetes mellitus without complication (HCC)   . Hx of CABG   . Renal disorder    kidney stones    Past Surgical History:  Procedure Laterality Date  . APPENDECTOMY    . CORONARY ARTERY BYPASS GRAFT    . TOTAL HIP ARTHROPLASTY       reports that he has never smoked. He has never used smokeless tobacco. He reports that he does not drink alcohol or use drugs.  No Known Allergies  Family History  Problem Relation Age of Onset  . CAD Father     Prior to Admission medications   Medication Sig Start Date End Date Taking? Authorizing Provider  aspirin EC 81 MG tablet Take by mouth.   Yes [provider]  atenolol (TENORMIN) 25 MG tablet Take 25 mg by mouth. 10/19/17  Yes [provider]  escitalopram (LEXAPRO) 5 MG tablet Take 5 mg by mouth daily.  03/01/18  Yes [provider]  ferrous sulfate 325 (65 FE) MG tablet Take 325 mg by mouth daily with breakfast.  11/21/17  Yes [provider]  Insulin Detemir (LEVEMIR FLEXTOUCH) 100 UNIT/ML Pen Inject 22 Units into the skin 2 (two) times daily. 11/14/17  Yes [provider]  insulin lispro (HUMALOG KWIKPEN) 100 UNIT/ML KwikPen Inject 26 Units into the skin 3 (three) times daily. 09/11/17  Yes [provider]  metFORMIN (GLUCOPHAGE) 1000 MG tablet 1,000 mg.  07/30/17  Yes [provider]  nortriptyline (PAMELOR) 75 MG capsule Take 75 mg by mouth at bedtime.  09/29/17  Yes [provider]  pravastatin (PRAVACHOL) 40 MG tablet Take by mouth daily.  02/13/18  Yes [provider]  PRESCRIPTION MEDICATION Inject 1 application into the muscle every 14 (fourteen) days. Testosterone cypionate 200mg /mL injection.  Inject 200mg  into the shoulder, thigh, or buttocks every 14 days.   Yes [provider]  sitaGLIPtin (JANUVIA) 100 MG tablet Take 100 mg by mouth every other day.  09/29/17  Yes [provider]  spironolactone (ALDACTONE) 25 MG tablet Take 25 mg by mouth every other day.  03/15/18  Yes [provider]  torsemide (DEMADEX) 20 MG tablet Take 20 mg by  mouth every other day.  10/20/17  Yes [provider]  nitroGLYCERIN (NITROSTAT) 0.4 MG SL tablet Place 0.4 mg under the tongue See admin instructions. Dissolve one tablet under the tongue every 5 minutes as needed for chest pain. Do not exceed a total of three doses in 15 minutes. 03/09/18   [provider]    Physical Exam: Vitals:   06/07/18 2145 06/07/18 2230 06/08/18 0001 06/08/18 0008  BP: (!) 143/57 (!) 156/65  (!) 154/65  Pulse: 87 91  95  Resp: (!) 26 (!) 27  (!) 24  Temp:    (!) 97.3 F (36.3 C) (!) 97.3 F (36.3 C)  TempSrc:   Oral Oral  SpO2: 98% 98%  95%  Weight:   85.6 kg   Height:    (1.803 m)       Constitutional: Moderately built and nourished. Vitals:   06/07/18 2145 06/07/18 2230 06/08/18 0001 06/08/18 0008  BP: (!) 143/57 (!) 156/65  (!) 154/65  Pulse: 87 91  95  Resp: (!) 26 (!) 27  (!) 24  Temp:   (!) 97.3 F (36.3 C) (!) 97.3 F (36.3 C)  TempSrc:   Oral Oral  SpO2: 98% 98%  95%  Weight:   85.6 kg   Height:    (1.803 m)    Eyes: Anicteric no pallor. ENMT: No discharge from the ears eyes nose and mouth. Neck: No mass or.  No neck rigidity. Respiratory: No rhonchi or crepitations. Cardiovascular: S1-S2 heard. Abdomen: Soft nontender bowel sounds present. Musculoskeletal: No edema.  No joint effusion. Skin: No rash. Neurologic: Alert awake oriented to time place and person.  Moves all extremities. Psychiatric: Appears normal per normal affect.   Labs on Admission: I have personally reviewed following labs and imaging studies  CBC: Recent Labs  Lab 06/07/18 1947  WBC 13.8*  HGB 13.1  HCT 42.6  MCV 95.7  PLT 206   Basic Metabolic Panel: Recent Labs  Lab 06/07/18 1947  NA 135  K 4.0  CL 97*  CO2 28  GLUCOSE 136*  BUN 29*  CREATININE 1.17  CALCIUM 9.5   GFR: Estimated Creatinine Clearance: 51 mL/min (by C-G formula based on SCr of 1.17 mg/dL). Liver Function Tests: Recent Labs  Lab 06/07/18 1947  AST 29  ALT 20  ALKPHOS 66  BILITOT 1.0  PROT 7.7  ALBUMIN 3.8   No results for input(s): LIPASE, AMYLASE in the last 168 hours. No results for input(s): AMMONIA in the last 168 hours. Coagulation Profile: No results for input(s): INR, PROTIME in the last 168 hours. Cardiac Enzymes: Recent Labs  Lab 06/07/18 1947  TROPONINI <0.03   BNP (last 3 results) No results for input(s): PROBNP in the last 8760 hours. HbA1C: No results for input(s): HGBA1C in the last 72 hours. CBG: Recent Labs    Lab 06/08/18 0018  GLUCAP 161*   Lipid Profile: No results for input(s): CHOL, HDL, LDLCALC, TRIG, CHOLHDL, LDLDIRECT in the last 72 hours. Thyroid Function Tests: No results for input(s): TSH, T4TOTAL, FREET4, T3FREE, THYROIDAB in the last 72 hours. Anemia Panel: No results for input(s): VITAMINB12, FOLATE, FERRITIN, TIBC, IRON, RETICCTPCT in the last 72 hours. Urine analysis:    Component Value Date/Time   COLORURINE YELLOW 06/07/2018 1947   APPEARANCEUR CLEAR 06/07/2018 1947   LABSPEC 1.010 06/07/2018 1947   PHURINE 6.0 06/07/2018 1947   GLUCOSEU 250 (A) 06/07/2018 1947   HGBUR TRACE (A) 06/07/2018 1947   BILIRUBINUR NEGATIVE  06/07/2018 1947   KETONESUR NEGATIVE 06/07/2018 1947   PROTEINUR NEGATIVE 06/07/2018 1947   NITRITE NEGATIVE 06/07/2018 1947   LEUKOCYTESUR NEGATIVE 06/07/2018 1947   Sepsis Labs: @LABRCNTIP (procalcitonin:4,lacticidven:4) )No results found for this or any previous visit (from the past 240 hour(s)).   Radiological Exams on Admission: Dg Chest 2 View  Result Date: 06/07/2018 CLINICAL DATA:  Productive cough, shortness of breath EXAM: CHEST - 2 VIEW COMPARISON:  None. FINDINGS: Prior CABG. Heart is normal size. Left base atelectasis or infiltrate. Patchy right upper lobe opacity for. No effusions or acute bony abnormality. IMPRESSION: Patchy opacities in the inferior right upper lobe and the left lung base. Cannot exclude early infiltrates/pneumonia. Electronically Signed   By: Charlett Nose M.D.   On: 06/07/2018 21:12    EKG: Independently reviewed.  Wandering pacemaker.  Assessment/Plan Principal Problem:   CAP (community acquired pneumonia) Active Problems:   Falls   CAD (coronary artery disease)   Type 2 diabetes mellitus with vascular disease (HCC)    1. Pneumonia -patient symptoms are consistent with community-acquired pneumonia for which patient is placed on ceftriaxone and Zithromax.  Follow sputum cultures influenza PCR cultures, urine for  Legionella strep antigen. 2. Recurrent falls -patient states he falls out of losing balance.  Denies any chest pain or palpitations.  Check orthostatics in the morning.  CT head unremarkable. 3. CAD status post CABG denies any chest pain.  On beta-blockers and statins. 4. Diastolic dysfunction last 2D echo done and October 2019 in care everywhere shows EF of 55 to 60% with diastolic dysfunction on torsemide and spironolactone. 5. Diabetes mellitus type 2 on Levemir and pre-meal NovoLog.  Hold oral hypoglycemics for now.   DVT prophylaxis: Lovenox. Code Status: Full code. Family Communication: Discussed with patient. Disposition Plan: Home. Consults called: None. Admission status: Inpatient.   Eduard Clos MD [facility] Pager [phone].  If 7PM-7AM, please contact night-coverage [email or web address] Password [email or web address]  06/08/2018, 2:54 AM

## 2018-06-09 DIAGNOSIS — E876 Hypokalemia: Secondary | ICD-10-CM | POA: Diagnosis present

## 2018-06-09 LAB — BASIC METABOLIC PANEL
Anion gap: 8 (ref 5–15)
BUN: 20 mg/dL (ref 8–23)
CO2: 29 mmol/L (ref 22–32)
Calcium: 8.3 mg/dL — ABNORMAL LOW (ref 8.9–10.3)
Chloride: 99 mmol/L (ref 98–111)
Creatinine, Ser: 0.94 mg/dL (ref 0.61–1.24)
GFR calc Af Amer: >60 mL/min (ref >60)
GFR calc non Af Amer: >60 mL/min (ref >60)
Glucose, Bld: 177 mg/dL — ABNORMAL HIGH (ref 70–99)
Potassium: 3.3 mmol/L — ABNORMAL LOW (ref 3.5–5.1)
SODIUM: 136 mmol/L (ref 135–145)

## 2018-06-09 LAB — URINE CULTURE: Culture: NO GROWTH

## 2018-06-09 LAB — GLUCOSE, CAPILLARY
GLUCOSE-CAPILLARY: 241 mg/dL — AB (ref 70–99)
Glucose-Capillary: 140 mg/dL — ABNORMAL HIGH (ref 70–99)
Glucose-Capillary: 114 mg/dL — ABNORMAL HIGH (ref 70–99)
Glucose-Capillary: 166 mg/dL — ABNORMAL HIGH (ref 70–99)
Glucose-Capillary: 270 mg/dL — ABNORMAL HIGH (ref 70–99)

## 2018-06-09 LAB — CBC
HEMATOCRIT: 36.6 % — AB (ref 39.0–52.0)
HEMOGLOBIN: 11.1 g/dL — AB (ref 13.0–17.0)
MCH: 29.4 pg (ref 26.0–34.0)
MCHC: 30.3 g/dL (ref 30.0–36.0)
MCV: 96.8 fL (ref 80.0–100.0)
Platelets: 211 10*3/uL (ref 150–400)
RBC: 3.78 MIL/uL — ABNORMAL LOW (ref 4.22–5.81)
RDW: 13.2 % (ref 11.5–15.5)
WBC: 13.2 10*3/uL — ABNORMAL HIGH (ref 4.0–10.5)
nRBC: 0 % (ref 0.0–0.2)

## 2018-06-09 LAB — LEGIONELLA PNEUMOPHILA SEROGP 1 UR AG: L. pneumophila Serogp 1 Ur Ag: NEGATIVE

## 2018-06-09 LAB — MAGNESIUM: Magnesium: 1.6 mg/dL — ABNORMAL LOW (ref 1.7–2.4)

## 2018-06-09 MED ORDER — MAGNESIUM SULFATE 2 GM/50ML IV SOLN
2.0000 g | Freq: Once | INTRAVENOUS | Status: AC
  Administered 2018-06-09: 2 g via INTRAVENOUS
  Filled 2018-06-09: qty 50

## 2018-06-09 MED ORDER — POTASSIUM CHLORIDE CRYS ER 20 MEQ PO TBCR
40.0000 meq | EXTENDED_RELEASE_TABLET | Freq: Once | ORAL | Status: AC
  Administered 2018-06-09: 40 meq via ORAL
  Filled 2018-06-09: qty 2

## 2018-06-09 NOTE — Progress Notes (Addendum)
PROGRESS NOTE    Colton Dunn  [name]:[id number] DOB: August 08, 1932 DOA: 06/07/2018 PCP: Sherrilyn Rist, MD   Chief complaint: Shortness of breath, cough, fall   Brief Narrative:   Colton Dunn is a 85 y.o. male with history of CAD status post CABG, diabetes mellitus, diastolic dysfunction, hyperlipidemia presents to the ER admits in [facility] with complaints of having wheezing cough weakness and had a 3 falls in the last 1 week.  Patient states since Monday, June 03, 2018 patient started having wheezing and coughing productive of sputum.  Since then patient started feeling weak and had 3 falls did not lose consciousness.  Has no chest pain.  Since patient weakness and cough persisted patient came to the ER at [facility].  ED Course: EKG was showing wandering pacemaker.  Labs showed leukocytosis of 13.8 lactate is mildly elevated.  Was given fluid bolus.  Chest x-ray shows infiltrates concerning for pneumonia and patient's clinical picture also fits with it.  Patient admitted for pneumonia and also will need further observation for his recurrent falls.  Patient was started on ceftriaxone and Zithromax.   Assessment & Plan:   Principal Problem:   CAP (community acquired pneumonia) Active Problems:   Falls   CAD (coronary artery disease)   Type 2 diabetes mellitus with vascular disease (HCC)   Acute respiratory failure with hypoxia and hypercapnia (HCC)   Hypokalemia   Hypomagnesemia   Acute hypoxic respiratory failure Pneumonia, suspect gram-negative organism Patient presenting with shortness of breath, productive cough.  Noted to have an elevated white blood cell count on admission of 13.1 with a lactic acid of 2.1.  Chest x-ray notable for patchy infiltrates right upper lobe and left lung base.  Requiring supplemental oxygen to maintain adequate SPO2.  Not on home O2. --Respiratory viral panel negative --Strep pneumo urinary antigen negative --Sputum  culture with few WBCs, rare gram-negative rods, no growth x 12h --Blood cultures x2 no growth x24 hours --urine Legionella antigen pending --Continue antibiotics with azithromycin and ceftriaxone --Continue to titrate supplemental oxygen to maintain SPO2 greater than 92% --6-minute walk test today to evaluate for desaturation on room air --Supportive care  Recurrent falls Patient currently resides alone.  Reports 3 falls without syncope over the past week.  He plans to eventually move in with either his son or daughter.  CT head unremarkable. --Continue to monitor on telemetry --PT recommends home health following discharge with walker --DME orders placed; social work for assistance --Patient currently lives alone, likely will need to move in with 1 of his children versus assisted living situation in the future.  CAD s/p CABG Chronic congestive heart failure with diastolic dysfunction Previous echocardiogram October 2019 notable for EF of 55-60% with diastolic dysfunction. --Continue atenolol 25 mg p.o. daily, spironolactone 25 mg QOD, torsemide 20 mg QOD --Continue statin and aspirin  Type 2 diabetes mellitus Home regimen includes sitagliptin 100 mg PO QOD, metformin 1000 mg, Levemir 22u [place] BID, Humalog 26u TIDAC.  Last hemoglobin A1c 6.3 on 02/22/2018. --Repeat hemoglobin A1c 7.1 on 06/08/2018 --Hold oral hypoglycemics, and mealtime Humalog --Continue Levemir at reduced dose of 15 units [place] BID --insulin sliding scale for further coverage --To monitor glucose closely especially in the setting of active infection.  Anxiety/depression --Continue home Lexapro 5 mg p.o. daily --Continue home nortriptyline 75 mg qHS  Iron deficiency anemia --Continue home ferrous sulfate 325 mg p.o. daily  Hypokalemia Hypomagnesemia K 3.3 and Mg 1.6 today.  Will replete with magnesium  sulfate 2 g IV and K-Dur 40 mEq p.o. --Repeat electrolytes in the a.m. to include magnesium.  DVT prophylaxis:  SCDs Code Status: Full code Family Communication: None Disposition Plan: Continue inpatient hospitalization, need to further titrate supplemental oxygen, likely discharge home with home health services in 1-2 days per physical therapy   Consultants:   None  Procedures:   None  Antimicrobials:   Azithromycin 2/29>>>  Ceftriaxone 2/29>>>   Subjective: Patient seen and examined at bedside, resting comfortably in bed.  Yesterday afternoon, patient with hypoglycemic event.  Insulin regimen was decreased.  Continues with dyspnea and productive cough, although both slightly improved.  Requests further mobilization today with physical therapy around the hallway.  No other complaints this morning.  Denies headache, no fever/chills/night sweats, no nausea/vomiting/diarrhea, no chest pain, no palpitations, no abdominal pain, no congestion, no sore throat, no issues with bowel/bladder function, no paresthesias.  No acute events overnight per nursing staff.  Objective: Vitals:   06/08/18 2048 06/09/18 0430 06/09/18 0500 06/09/18 0636  BP: (!) 111/56 (!) 136/59    Pulse: 76 80  78  Resp: 18 18    Temp: 98.3 F (36.8 C) 98.3 F (36.8 C)    TempSrc: Oral Oral    SpO2: 93% (!) 87%  93%  Weight:   86.8 kg   Height:        Intake/Output Summary (Last 24 hours) at 06/09/2018 0946 Last data filed at 06/09/2018 0449 Gross per 24 hour  Intake 1997.26 ml  Output 1525 ml  Net 472.26 ml   Filed Weights   06/08/18 0001 06/08/18 0445 06/09/18 0500  Weight: 85.6 kg 86.3 kg 86.8 kg    Examination:  General exam: Appears calm and comfortable  Respiratory system: Coarse breath sounds bilaterally with mild rhonchi left base, normal respiratory effort, on 2 L nasal cannula (not on home O2) Cardiovascular system: S1 & S2 heard, RRR. No JVD, murmurs, rubs, gallops or clicks. No pedal edema. Gastrointestinal system: Abdomen is nondistended, soft and nontender. No organomegaly or masses felt. Normal  bowel sounds heard. Central nervous system: Alert and oriented. No focal neurological deficits. Extremities: Symmetric 5 x 5 power. Skin: No rashes, lesions or ulcers Psychiatry: Judgement and insight appear normal. Mood & affect appropriate.     Data Reviewed: I have personally reviewed following labs and imaging studies  CBC: Recent Labs  Lab 06/07/18 1947 06/08/18 0307 06/09/18 0505  WBC 13.8* 13.1* 13.2*  NEUTROABS  --  9.5*  --   HGB 13.1 11.4* 11.1*  HCT 42.6 37.2* 36.6*  MCV 95.7 97.4 96.8  PLT 206 188 211   Basic Metabolic Panel: Recent Labs  Lab 06/07/18 1947 06/08/18 0307 06/09/18 0505  NA 135 137 136  K 4.0 3.9 3.3*  CL 97* 101 99  CO2 GLUCOSE 136* 244* 177*  BUN 29* 24* 20  CREATININE 1.17 0.98 0.94  CALCIUM 9.5 8.8* 8.3*  MG  --  1.5* 1.6*   GFR: Estimated Creatinine Clearance: 63.4 mL/min (by C-G formula based on SCr of 0.94 mg/dL). Liver Function Tests: Recent Labs  Lab 06/07/18 1947 06/08/18 0307  AST 29 23  ALT 20 18  ALKPHOS 66 57  BILITOT 1.0 0.8  PROT 7.7 6.8  ALBUMIN 3.8 3.3*   No results for input(s): LIPASE, AMYLASE in the last 168 hours. No results for input(s): AMMONIA in the last 168 hours. Coagulation Profile: No results for input(s): INR, PROTIME in the last 168 hours. Cardiac  Enzymes: Recent Labs  Lab 06/07/18 1947 06/08/18 0307  TROPONINI <0.03 0.03*   BNP (last 3 results) No results for input(s): PROBNP in the last 8760 hours. HbA1C: Recent Labs    06/08/18 0957  HGBA1C 7.1*   CBG: Recent Labs  Lab 06/08/18 1611 06/08/18 1636 06/08/18 2123 06/09/18 0549 06/09/18 0730  GLUCAP 61* 79 147* 140* 114*   Lipid Profile: No results for input(s): CHOL, HDL, LDLCALC, TRIG, CHOLHDL, LDLDIRECT in the last 72 hours. Thyroid Function Tests: Recent Labs    06/08/18 0307  TSH 2.205   Anemia Panel: No results for input(s): VITAMINB12, FOLATE, FERRITIN, TIBC, IRON, RETICCTPCT in the last 72  hours. Sepsis Labs: Recent Labs  Lab 06/07/18 1947 06/08/18 0718  LATICACIDVEN 2.1* 0.8    Recent Results (from the past 240 hour(s))  Urine C&S     Status: None   Collection Time: 06/07/18  7:47 PM  Result Value Ref Range Status   Specimen Description   Final    URINE, CATHETERIZED Performed at [facility] [place], [address] [place], [place] [address]    Special Requests   Final    NONE Performed at [facility] [place], [address] [place], [place] [address]    Culture   Final    NO GROWTH Performed at [facility], [address] [place], [place] [address]    Report Status 06/09/2018 FINAL  Final  Blood Culture (routine x 2)     Status: None (Preliminary result)   Collection Time: 06/07/18  7:50 PM  Result Value Ref Range Status   Specimen Description   Final    BLOOD BLOOD LEFT FOREARM Performed at [facility] [place], [address] [place], [place] [address]    Special Requests   Final    BOTTLES DRAWN AEROBIC AND ANAEROBIC Blood Culture adequate volume Performed at [facility] [place], [address] [place], [place] [address]    Culture   Final    NO GROWTH < 24 HOURS Performed at [facility], [address] [place], [place] [address]    Report Status PENDING  Incomplete  Blood Culture (routine x 2)     Status: None (Preliminary result)   Collection Time: 06/07/18  7:55 PM  Result Value Ref Range Status   Specimen Description   Final    BLOOD BLOOD RIGHT FOREARM Performed at [facility] [place], [address] [place], [place] [address]    Special Requests   Final    BOTTLES DRAWN AEROBIC AND ANAEROBIC Blood Culture adequate volume Performed at [facility] [place], [address] [place], [place] [address]    Culture   Final    NO GROWTH < 24 HOURS Performed at [facility], [address] [place], [place] [address]    Report Status PENDING  Incomplete  Respiratory Panel by PCR     Status:  None   Collection Time: 06/08/18  1:42 AM  Result Value Ref Range Status   Adenovirus NOT DETECTED NOT DETECTED Final   Coronavirus 229E NOT DETECTED NOT DETECTED Final    Comment: (NOTE) The Coronavirus on the Respiratory Panel, DOES NOT test for the novel  Coronavirus (2019 nCoV)    Coronavirus HKU1 NOT DETECTED NOT DETECTED Final   Coronavirus NL63 NOT DETECTED NOT DETECTED Final   Coronavirus OC43 NOT DETECTED NOT DETECTED Final   Metapneumovirus NOT DETECTED NOT DETECTED  Final   Rhinovirus / Enterovirus NOT DETECTED NOT DETECTED Final   Influenza A NOT DETECTED NOT DETECTED Final   Influenza B NOT DETECTED NOT DETECTED Final   Parainfluenza Virus 1 NOT DETECTED NOT DETECTED Final   Parainfluenza Virus 2 NOT DETECTED NOT DETECTED Final   Parainfluenza Virus 3 NOT DETECTED NOT DETECTED Final   Parainfluenza Virus 4 NOT DETECTED NOT DETECTED Final   Respiratory Syncytial Virus NOT DETECTED NOT DETECTED Final   Bordetella pertussis NOT DETECTED NOT DETECTED Final   Chlamydophila pneumoniae NOT DETECTED NOT DETECTED Final   Mycoplasma pneumoniae NOT DETECTED NOT DETECTED Final    Comment: Performed at [facility], [address] [place], [place] [address]  Culture, sputum-assessment     Status: None   Collection Time: 06/08/18  2:49 AM  Result Value Ref Range Status   Specimen Description SPUTUM  Final   Special Requests NONE  Final   Sputum evaluation   Final    THIS SPECIMEN IS ACCEPTABLE FOR SPUTUM CULTURE Performed at [facility], [address] [place], [place] [address]    Report Status 06/08/2018 FINAL  Final  Culture, respiratory     Status: None (Preliminary result)   Collection Time: 06/08/18  2:49 AM  Result Value Ref Range Status   Specimen Description   Final    SPUTUM Performed at [facility], [address], [place], [place] [address]    Special Requests   Final    NONE Reflexed from [id number] Performed at [facility], [address] [place], [place] [address]    Gram Stain   Final    FEW WBC PRESENT, PREDOMINANTLY PMN RARE GRAM NEGATIVE RODS    Culture   Final    NO GROWTH < 12 HOURS Performed at [facility], [address] [place], [place] [address]    Report Status PENDING  Incomplete         Radiology Studies: Dg Chest 2 View  Result Date: 06/07/2018 CLINICAL DATA:  Productive cough, shortness of breath EXAM: CHEST - 2 VIEW COMPARISON:  None. FINDINGS: Prior CABG. Heart is normal size. Left base atelectasis or infiltrate. Patchy right upper lobe opacity for. No effusions or acute bony abnormality. IMPRESSION: Patchy opacities in the inferior right upper lobe and the left lung base. Cannot exclude early infiltrates/pneumonia. Electronically Signed   By: Charlett Nose M.D.   On: 06/07/2018 21:12   Ct Head Wo Contrast  Result Date: 06/08/2018 CLINICAL DATA:  Initial evaluation for acute altered mental status. EXAM: CT HEAD WITHOUT CONTRAST TECHNIQUE: Contiguous axial images were obtained from the base of the skull through the vertex without intravenous contrast. COMPARISON:  None. FINDINGS: Brain: Examination degraded by motion artifact. Age-related cerebral atrophy with mild chronic small vessel ischemic disease. No acute intracranial hemorrhage. No acute large vessel territory infarct. No mass lesion, midline shift or mass effect. No hydrocephalus. No extra-axial fluid collection. Vascular: No hyperdense vessel. Scattered vascular calcifications noted within the carotid siphons. Skull: Scalp soft tissues demonstrate no acute finding. Calvarium intact. Sinuses/Orbits: Globes and orbital soft tissues within normal limits. Mucosal thickening present within the ethmoidal air cells and maxillary sinuses, chronic in appearance. No mastoid effusion. Other: None. IMPRESSION: 1. No acute intracranial abnormality. 2. Age-related cerebral atrophy with mild chronic small vessel  ischemic disease. Electronically Signed   By: Rise Mu M.D.   On: 06/08/2018 04:51        Scheduled  Meds: . aspirin EC  81 mg Oral Daily  . atenolol  25 mg Oral Daily  . chlorhexidine  15 mL Mouth Rinse BID  . erythromycin   Right Eye Q8H  . escitalopram  5 mg Oral Daily  . ferrous sulfate  325 mg Oral Q breakfast  . insulin aspart  0-9 Units Subcutaneous TID WC  . insulin detemir  15 Units Subcutaneous BID  . mouth rinse  15 mL Mouth Rinse q12n4p  . nortriptyline  75 mg Oral QHS  . pneumococcal 23 valent vaccine  0.5 mL Intramuscular Tomorrow-1000  . pravastatin  40 mg Oral q1800  . spironolactone  25 mg Oral QODAY  . torsemide  20 mg Oral QODAY   Continuous Infusions: . azithromycin 500 mg (06/08/18 2003)  . cefTRIAXone (ROCEPHIN)  IV 1 g (06/08/18 2134)     LOS: 2 days    Time spent: 30 minutes    Alvira Philips [place], DO [facility] Pager [phone]  If 7PM-7AM, please contact night-coverage [email or web address] Password [facility] 06/09/2018, 9:46 AM

## 2018-06-09 NOTE — Progress Notes (Addendum)
SATURATION QUALIFICATIONS: (This note is used to comply with regulatory documentation for home oxygen)  Patient Saturations on Room Air at Rest = 94%  Patient Saturations on Room Air while Ambulating = 80%  Patient Saturations on 2 Liters of oxygen while Ambulating = 92%  Please briefly explain why patient needs home oxygen:  Nery Kalisz, Lavone Orn, RN

## 2018-06-10 DIAGNOSIS — J9602 Acute respiratory failure with hypercapnia: Secondary | ICD-10-CM

## 2018-06-10 DIAGNOSIS — I25810 Atherosclerosis of coronary artery bypass graft(s) without angina pectoris: Secondary | ICD-10-CM

## 2018-06-10 DIAGNOSIS — J9601 Acute respiratory failure with hypoxia: Secondary | ICD-10-CM

## 2018-06-10 LAB — GLUCOSE, CAPILLARY
GLUCOSE-CAPILLARY: 67 mg/dL — AB (ref 70–99)
Glucose-Capillary: 87 mg/dL (ref 70–99)
Glucose-Capillary: 205 mg/dL — ABNORMAL HIGH (ref 70–99)

## 2018-06-10 LAB — CBC
HCT: 39 % (ref 39.0–52.0)
Hemoglobin: 11.6 g/dL — ABNORMAL LOW (ref 13.0–17.0)
MCH: 29.4 pg (ref 26.0–34.0)
MCHC: 29.7 g/dL — ABNORMAL LOW (ref 30.0–36.0)
MCV: 98.7 fL (ref 80.0–100.0)
Platelets: 234 10*3/uL (ref 150–400)
RBC: 3.95 MIL/uL — ABNORMAL LOW (ref 4.22–5.81)
RDW: 13.1 % (ref 11.5–15.5)
WBC: 15.6 10*3/uL — ABNORMAL HIGH (ref 4.0–10.5)
nRBC: 0 % (ref 0.0–0.2)

## 2018-06-10 LAB — BASIC METABOLIC PANEL WITH GFR
Anion gap: 7 (ref 5–15)
BUN: 18 mg/dL (ref 8–23)
CO2: 30 mmol/L (ref 22–32)
Calcium: 8.7 mg/dL — ABNORMAL LOW (ref 8.9–10.3)
Chloride: 101 mmol/L (ref 98–111)
Creatinine, Ser: 0.95 mg/dL (ref 0.61–1.24)
GFR calc Af Amer: >60 mL/min
GFR calc non Af Amer: >60 mL/min
Glucose, Bld: 140 mg/dL — ABNORMAL HIGH (ref 70–99)
Potassium: 3.8 mmol/L (ref 3.5–5.1)
Sodium: 138 mmol/L (ref 135–145)

## 2018-06-10 LAB — MAGNESIUM: Magnesium: 1.9 mg/dL (ref 1.7–2.4)

## 2018-06-10 LAB — CULTURE, RESPIRATORY W GRAM STAIN: Culture: NORMAL

## 2018-06-10 MED ORDER — AZITHROMYCIN 250 MG PO TABS: ORAL_TABLET | ORAL | 0 refills | Status: AC

## 2018-06-10 MED ORDER — INSULIN LISPRO (1 UNIT DIAL) 100 UNIT/ML (KWIKPEN): 6.0000 [IU] | PEN_INJECTOR | Freq: Three times a day (TID) | SUBCUTANEOUS | 0 refills | Status: AC

## 2018-06-10 MED ORDER — INSULIN DETEMIR 100 UNIT/ML FLEXPEN: 15.0000 [IU] | PEN_INJECTOR | Freq: Two times a day (BID) | SUBCUTANEOUS | 0 refills | Status: AC

## 2018-06-10 MED ORDER — INSULIN DETEMIR 100 UNIT/ML ~~LOC~~ SOLN
10.0000 [IU] | Freq: Two times a day (BID) | SUBCUTANEOUS | Status: DC
  Filled 2018-06-10: qty 0.1

## 2018-06-10 MED ORDER — CEFDINIR 300 MG PO CAPS: 300.0000 mg | ORAL_CAPSULE | Freq: Two times a day (BID) | ORAL | 0 refills | Status: AC

## 2018-06-10 NOTE — Progress Notes (Signed)
Spoke with pt concerning HH. Pt states that he is not sure of where he is going. He is in [place] helping his daughter and may go to his son's home in [place]. He has a RW. Pt states right now he did not want home health and asked if he change his mind what to do. Encouraged pt to call his PCP if he changed and want HH.

## 2018-06-10 NOTE — Progress Notes (Signed)
Physical Therapy Treatment Patient Details Name: Colton Dunn MRN: [id number] DOB: 1932/07/22 Today's Date: 06/10/2018    History of Present Illness 85 y.o. male with history of CAD status post CABG, diabetes mellitus, diastolic dysfunction, hyperlipidemia admitted from [facility] with  wheezing cough weakness and had a 3 falls in the last 1 week, Dx with CAP    PT Comments    The patient is found up in room, packing up his things. Patient ambulated x 400' with supervision/min guard. Oxygen saturation on RA 100%. Plans Dc home. Rec. HHPT.  Follow Up Recommendations  Home health PT     Equipment Recommendations  None recommended by PT    Recommendations for Other Services       Precautions / Restrictions Precautions Precautions: Fall    Mobility  Bed Mobility               General bed mobility comments: up in room ad lib  Transfers Overall transfer level: Independent Equipment used: Rolling walker (2 wheeled) Transfers: Sit to/from Stand              Ambulation/Gait Ambulation/Gait assistance: Chief Operating Officer (Feet): 400 Feet Assistive device: Rolling walker (2 wheeled) Gait Pattern/deviations: Step-through pattern Gait velocity: decr   General Gait Details: very slight right lateral balance loss after turning corner   Stairs             Wheelchair Mobility    Modified Rankin (Stroke Patients Only)       Balance                                            Cognition Arousal/Alertness: Awake/alert                                            Exercises      General Comments        Pertinent Vitals/Pain      Home Living                      Prior Function            PT Goals (current goals can now be found in the care plan section) Progress towards PT goals: Progressing toward goals    Frequency    Min 3X/week      PT Plan Current plan remains appropriate     Co-evaluation              AM-PAC PT "6 Clicks" Mobility   Outcome Measure  Help needed turning from your back to your side while in a flat bed without using bedrails?: A Little Help needed moving from lying on your back to sitting on the side of a flat bed without using bedrails?: A Little Help needed moving to and from a bed to a chair (including a wheelchair)?: A Little Help needed standing up from a chair using your arms (e.g., wheelchair or bedside chair)?: A Little Help needed to walk in hospital room?: A Little Help needed climbing 3-5 steps with a railing? : A Little 6 Click Score: 18    End of Session   Activity Tolerance: Patient tolerated treatment well Patient left: in chair   PT Visit Diagnosis: Unsteadiness on feet (R26.81)  Time: 0160-1093 PT Time Calculation (min) (ACUTE ONLY): 16 min  Charges:                        Blanchard Kelch PT Acute Rehabilitation Services Pager [phone] Office [phone]    Rada Hay 06/10/2018, 2:58 PM

## 2018-06-10 NOTE — Progress Notes (Signed)
Occupational Therapy Treatment Patient Details Name: Colton Dunn MRN: [id number] DOB: 16-Dec-1932 Today's Date: 06/10/2018    History of present illness 85 y.o. male with history of CAD status post CABG, diabetes mellitus, diastolic dysfunction, hyperlipidemia admitted from [facility] with  wheezing cough weakness and had a 3 falls in the last 1 week, Dx with CAP   OT comments  Pt overall S- mod I with ADL activity.Pt states daughter  will A needed.    Follow Up Recommendations  No OT follow up    Equipment Recommendations  None recommended by OT    Recommendations for Other Services      Precautions / Restrictions Precautions Precautions: Fall       Mobility Bed Mobility               General bed mobility comments: up in room ad lib  Transfers Overall transfer level: Independent Equipment used: Rolling walker (2 wheeled) Transfers: Sit to/from Stand Sit to Stand: Supervision              Balance Overall balance assessment: Needs assistance;History of Falls(reports 3 falls in the last week since being sick)   Sitting balance-Leahy Scale: Good       Standing balance-Leahy Scale: Good(fair to good, NT to mod challenges)                             ADL either performed or assessed with clinical judgement   ADL Overall ADL's : At baseline                                       General ADL Comments: pt overall S- mod I with simple ADL activity in the room including dressing, obtaining needed items and toileting.               Cognition Arousal/Alertness: Awake/alert Behavior During Therapy: WFL for tasks assessed/performed Overall Cognitive Status: Within Functional Limits for tasks assessed                                                  Progress Toward Goals  OT Goals(current goals can now be found in the care plan section)     Acute Rehab OT Goals Patient Stated Goal: home soon, back  to doing therapy  Plan   DC home with daughter      AM-PAC OT "6 Clicks" Daily Activity     Outcome Measure   Help from another person eating meals?: None Help from another person taking care of personal grooming?: None Help from another person toileting, which includes using toliet, bedpan, or urinal?: None Help from another person bathing (including washing, rinsing, drying)?: None Help from another person to put on and taking off regular upper body clothing?: None Help from another person to put on and taking off regular lower body clothing?: None 6 Click Score: 24    End of Session Equipment Utilized During Treatment: Rolling walker      Activity Tolerance Patient tolerated treatment well   Patient Left in chair   Nurse Communication Mobility status        Time: 7543-6067 OT Time Calculation (min): 14 min  Charges: OT General Charges $OT Visit: 1  Visit OT Evaluation $OT Eval Low Complexity: 1 Low  Lise Auer, OT Acute Rehabilitation Services [phone] Office- [phone]      Desma Wilkowski, Karin Golden D 06/10/2018, 5:11 PM

## 2018-06-10 NOTE — Care Management Note (Signed)
Case Management Note  Patient Details  Name: Colton Dunn MRN: [id number] Date of Birth: [birth year]  Subjective/Objective:                    Action/Plan: Pt declined HH at present time. Encouraged to call his PCP if he change his mind. Pt has RW.   Expected Discharge Date:                  Expected Discharge Plan:     In-House Referral:     Discharge planning Services     Post Acute Care Choice:    Choice offered to:     DME Arranged:    DME Agency:     HH Arranged:    HH Agency:     Status of Service:     If discussed at [employer], dates discussed:    Additional CommentsGeni Bers, RN 06/10/2018, 11:11 AM

## 2018-06-10 NOTE — Progress Notes (Addendum)
Pt's home address is [address] [place], [place] [address]. [employer] [facility] to service for [facility]. [employer] to service for home O2. Referral given. Need to know if pt is going home or to son/daughter home.

## 2018-06-10 NOTE — Care Management Important Message (Signed)
Important Message  Patient Details  Name: Ramone Blumenstock MRN: [id number] Date of Birth: 1933-03-31   Medicare Important Message Given:  Yes    Caren Macadam 06/10/2018, 11:47 AMImportant Message  Patient Details  Name: Boyan Cheever MRN: [id number] Date of Birth: Oct 21, 1932   Medicare Important Message Given:  Yes    Caren Macadam 06/10/2018, 11:47 AM

## 2018-06-10 NOTE — Progress Notes (Signed)
Pt going to daughter's [place] home, address [address] [place], [place] [address]

## 2018-06-10 NOTE — Progress Notes (Signed)
Hypoglycemic Event  CBG: 67  Treatment: 4 oz juice/soda  Symptoms: None  Follow-up CBG: Time:0830 CBG Result:87  Possible Reasons for Event: Unknown  Comments/MD notified: Fara Boros

## 2018-06-10 NOTE — Care Management Note (Signed)
Case Management Note  Patient Details  Name: Colton Dunn MRN: [id number] Date of Birth: [birth year]  Subjective/Objective:                    Action/Plan:[facility] could not service the pt at 4:11, Frances Furbish was called and will service the pt for [facility]    Expected Discharge Date:  06/10/18               Expected Discharge Plan:  Home w Home Health Services  In-House Referral:     Discharge planning Services  CM Consult  Post Acute Care Choice:    Choice offered to:     DME Arranged:    DME Agency:     HH Arranged:  RN, PT HH Agency:  [facility]  Status of Service:  Completed, signed off  If discussed at Long Length of Stay Meetings, dates discussed:    Additional CommentsGeni Bers, RN 06/10/2018, 4:17 PM

## 2018-06-10 NOTE — Discharge Summary (Signed)
Physician Discharge Summary  Colton Dunn NLG:[id number] DOB: September 30, 1932 DOA: 06/07/2018  PCP: Sherrilyn Rist, MD  Admit date: 06/07/2018 Discharge date: 06/10/2018  Admitted From: Home Disposition:  Home  Recommendations for Outpatient Follow-up:  1. Follow up with PCP in 1-2 weeks  Home Health:Pt   Discharge Condition:Improved CODE STATUS:Full Diet recommendation: Diabetic   Brief/Interim Summary: 85 y.o.malewithhistory of CAD status post CABG, diabetes mellitus, diastolic dysfunction, hyperlipidemia presents to the ER admits in [facility] with complaints of having wheezing cough weakness and had a 3 falls in the last 1 week. Patient states since Monday, June 03, 2018 patient started having wheezing and coughing productive of sputum. Since then patient started feeling weak and had 3 falls did not lose consciousness. Has no chest pain. Since patient weakness and cough persisted patient came to the ER at [facility].  ED Course:EKG was showing wandering pacemaker. Labs showed leukocytosis of 13.8 lactate is mildly elevated. Was given fluid bolus. Chest x-ray shows infiltrates concerning for pneumonia and patient's clinical picture also fits with it. Patient admitted for pneumonia and also will need further observation for his recurrent falls. Patient was started on ceftriaxone and Zithromax.   Discharge Diagnoses:  Principal Problem:   CAP (community acquired pneumonia) Active Problems:   Falls   CAD (coronary artery disease)   Type 2 diabetes mellitus with vascular disease (HCC)   Acute respiratory failure with hypoxia and hypercapnia (HCC)   Hypokalemia   Hypomagnesemia  Acute hypoxic respiratory failure Pneumonia, suspect gram-negative organism Patient presenting with shortness of breath, productive cough.  Noted to have an elevated white blood cell count on admission of 13.1 with a lactic acid of 2.1.  Chest x-ray notable for patchy  infiltrates right upper lobe and left lung base.  Requiring supplemental oxygen to maintain adequate SPO2.  Not on home O2. --Respiratory viral panel negative --Strep pneumo urinary antigen negative --Sputum culture with few WBCs, rare gram-negative rods, no growth x 12h --Blood cultures x2 no growth x24 hours --urine Legionella antigen neg --Continued antibiotics with azithromycin and ceftriaxone --Weaned to room air at rest and O2 on ambulation --discharge home with home o2  Recurrent falls Patient currently resides alone.  Reports 3 falls without syncope over the past week.  He plans to eventually move in with either his son or daughter.  CT head unremarkable. --Continue to monitor on telemetry --PT recommends home health following discharge with walker --DME orders placed; social work for assistance --Patient currently lives alone, likely will need to move in with 1 of his children versus assisted living situation in the future.  CAD s/p CABG Chronic congestive heart failure with diastolic dysfunction Previous echocardiogram October 2019 notable for EF of 55-60% with diastolic dysfunction. --Continue atenolol 25 mg p.o. daily, spironolactone 25 mg QOD, torsemide 20 mg QOD --Continue statin and aspirin  Type 2 diabetes mellitus Home regimen includes sitagliptin 100 mg PO QOD, metformin 1000 mg, Levemir 22u Grainger BID, Humalog 26u TIDAC.  Last hemoglobin A1c 6.3 on 02/22/2018. --Repeat hemoglobin A1c 7.1 on 06/08/2018 --Hold oral hypoglycemics, and mealtime Humalog --Continue Levemir at reduced dose of 15 units Livengood BID --consulted diabetic coordinator. Concern for medication/diet noncompliance prior to admit -Recommendation for reduced novolog dose of 6 units before meals --Follow up with PCP after discharge  Anxiety/depression --Continue home Lexapro 5 mg p.o. daily --Continue home nortriptyline 75 mg qHS  Iron deficiency anemia --Continue home ferrous sulfate 325 mg p.o.  daily  Hypokalemia Hypomagnesemia Replaced  Discharge Instructions   Allergies as of 06/10/2018   No Known Allergies     Medication List    STOP taking these medications   metFORMIN 1000 MG tablet Commonly known as:  GLUCOPHAGE     TAKE these medications   aspirin EC 81 MG tablet Take by mouth.   atenolol 25 MG tablet Commonly known as:  TENORMIN Take 25 mg by mouth.   azithromycin 250 MG tablet Commonly known as:  ZITHROMAX 1 tab po daily x 3 more days, zero refills   cefdinir 300 MG capsule Commonly known as:  OMNICEF Take 1 capsule (300 mg total) by mouth 2 (two) times daily for 3 days.   escitalopram 5 MG tablet Commonly known as:  LEXAPRO Take 5 mg by mouth daily.   ferrous sulfate 325 (65 FE) MG tablet Take 325 mg by mouth daily with breakfast.   Insulin Detemir 100 UNIT/ML Pen Commonly known as:  LEVEMIR FLEXTOUCH Inject 15 Units into the skin 2 (two) times daily. What changed:  how much to take   insulin lispro 100 UNIT/ML KwikPen Commonly known as:  HUMALOG KWIKPEN Inject 0.06 mLs (6 Units total) into the skin 3 (three) times daily. What changed:  how much to take   nitroGLYCERIN 0.4 MG SL tablet Commonly known as:  NITROSTAT Place 0.4 mg under the tongue See admin instructions. Dissolve one tablet under the tongue every 5 minutes as needed for chest pain. Do not exceed a total of three doses in 15 minutes.   nortriptyline 75 MG capsule Commonly known as:  PAMELOR Take 75 mg by mouth at bedtime.   pravastatin 40 MG tablet Commonly known as:  PRAVACHOL Take by mouth daily.   PRESCRIPTION MEDICATION Inject 1 application into the muscle every 14 (fourteen) days. Testosterone cypionate /mL injection.  Inject  into the shoulder, thigh, or buttocks every 14 days.   sitaGLIPtin 100 MG tablet Commonly known as:  JANUVIA Take 100 mg by mouth every other day.   spironolactone 25 MG tablet Commonly known as:  ALDACTONE Take 25 mg by  mouth every other day.   torsemide 20 MG tablet Commonly known as:  DEMADEX Take 20 mg by mouth every other day.            Durable Medical Equipment  (From admission, onward)         Start     Ordered   06/10/18 1155  For home use only DME oxygen  Once    Question Answer Comment  Mode or (Route) Nasal cannula   Liters per Minute 2   Frequency Continuous (stationary and portable oxygen unit needed)   Oxygen delivery system Gas      06/10/18 1155   06/08/18 1424  For home use only DME Walker rolling  Once    Question:  Patient needs a walker to treat with the following condition  Answer:  Gait abnormality   06/08/18 1425         Follow-up Information    Sherrilyn Rist, MD. Schedule an appointment as soon as possible for a visit in 1 week(s).   Specialty:  Internal Medicine Contact information: [address] [place] [address] [phone]          No Known Allergies   Procedures/Studies: Dg Chest 2 View  Result Date: 06/07/2018 CLINICAL DATA:  Productive cough, shortness of breath EXAM: CHEST - 2 VIEW COMPARISON:  None. FINDINGS: Prior CABG. Heart is normal size. Left base  atelectasis or infiltrate. Patchy right upper lobe opacity for. No effusions or acute bony abnormality. IMPRESSION: Patchy opacities in the inferior right upper lobe and the left lung base. Cannot exclude early infiltrates/pneumonia. Electronically Signed   By: Charlett Nose M.D.   On: 06/07/2018 21:12   Ct Head Wo Contrast  Result Date: 06/08/2018 CLINICAL DATA:  Initial evaluation for acute altered mental status. EXAM: CT HEAD WITHOUT CONTRAST TECHNIQUE: Contiguous axial images were obtained from the base of the skull through the vertex without intravenous contrast. COMPARISON:  None. FINDINGS: Brain: Examination degraded by motion artifact. Age-related cerebral atrophy with mild chronic small vessel ischemic disease. No acute intracranial hemorrhage. No acute large  vessel territory infarct. No mass lesion, midline shift or mass effect. No hydrocephalus. No extra-axial fluid collection. Vascular: No hyperdense vessel. Scattered vascular calcifications noted within the carotid siphons. Skull: Scalp soft tissues demonstrate no acute finding. Calvarium intact. Sinuses/Orbits: Globes and orbital soft tissues within normal limits. Mucosal thickening present within the ethmoidal air cells and maxillary sinuses, chronic in appearance. No mastoid effusion. Other: None. IMPRESSION: 1. No acute intracranial abnormality. 2. Age-related cerebral atrophy with mild chronic small vessel ischemic disease. Electronically Signed   By: Rise Mu M.D.   On: 06/08/2018 04:51     Subjective: Eager to go home  Discharge Exam: Vitals:   06/09/18 2012 06/10/18 0439  BP: (!) 130/56 (!) 147/61  Pulse: 75 79  Resp: 18 16  Temp: 98.4 F (36.9 C) 98.2 F (36.8 C)  SpO2: 96% 95%   Vitals:   06/09/18 1418 06/09/18 2012 06/10/18 0439 06/10/18 0456  BP: (!) 152/94 (!) 130/56 (!) 147/61   Pulse: 64 75 79   Resp: Temp: 98 F (36.7 C) 98.4 F (36.9 C) 98.2 F (36.8 C)   TempSrc: Oral Oral Oral   SpO2: 100% 96% 95%   Weight:    91.1 kg  Height:        General: Pt is alert, awake, not in acute distress Cardiovascular: RRR, S1/S2 +, no rubs, no gallops Respiratory: CTA bilaterally, no wheezing, no rhonchi Abdominal: Soft, NT, ND, bowel sounds + Extremities: no edema, no cyanosis   The results of significant diagnostics from this hospitalization (including imaging, microbiology, ancillary and laboratory) are listed below for reference.     Microbiology: Recent Results (from the past 240 hour(s))  Urine C&S     Status: None   Collection Time: 06/07/18  7:47 PM  Result Value Ref Range Status   Specimen Description   Final    URINE, CATHETERIZED Performed at [facility], [address] [place], [place] [address]    Special Requests    Final    NONE Performed at [facility], [address] [place], [place] [address]    Culture   Final    NO GROWTH Performed at [facility], [address], [place], [place] [address]    Report Status 06/09/2018 FINAL  Final  Blood Culture (routine x 2)     Status: None (Preliminary result)   Collection Time: 06/07/18  7:50 PM  Result Value Ref Range Status   Specimen Description BLOOD BLOOD LEFT FOREARM  Final   Special Requests   Final    BOTTLES DRAWN AEROBIC AND ANAEROBIC Blood Culture adequate volume Performed at [facility], [address] [place], [place] [address]    Culture NO GROWTH 3 DAYS  Final  Report Status PENDING  Incomplete  Blood Culture (routine x 2)     Status: None (Preliminary result)   Collection Time: 06/07/18  7:55 PM  Result Value Ref Range Status   Specimen Description BLOOD BLOOD RIGHT FOREARM  Final   Special Requests   Final    BOTTLES DRAWN AEROBIC AND ANAEROBIC Blood Culture adequate volume Performed at [facility], [address] [place], [place] [address]    Culture NO GROWTH 3 DAYS  Final   Report Status PENDING  Incomplete  Respiratory Panel by PCR     Status: None   Collection Time: 06/08/18  1:42 AM  Result Value Ref Range Status   Adenovirus NOT DETECTED NOT DETECTED Final   Coronavirus 229E NOT DETECTED NOT DETECTED Final    Comment: (NOTE) The Coronavirus on the Respiratory Panel, DOES NOT test for the novel  Coronavirus (2019 nCoV)    Coronavirus HKU1 NOT DETECTED NOT DETECTED Final   Coronavirus NL63 NOT DETECTED NOT DETECTED Final   Coronavirus OC43 NOT DETECTED NOT DETECTED Final   Metapneumovirus NOT DETECTED NOT DETECTED Final   Rhinovirus / Enterovirus NOT DETECTED NOT DETECTED Final   Influenza A NOT DETECTED NOT DETECTED Final   Influenza B NOT DETECTED NOT DETECTED Final   Parainfluenza Virus 1 NOT DETECTED NOT DETECTED Final   Parainfluenza Virus 2 NOT DETECTED NOT DETECTED  Final   Parainfluenza Virus 3 NOT DETECTED NOT DETECTED Final   Parainfluenza Virus 4 NOT DETECTED NOT DETECTED Final   Respiratory Syncytial Virus NOT DETECTED NOT DETECTED Final   Bordetella pertussis NOT DETECTED NOT DETECTED Final   Chlamydophila pneumoniae NOT DETECTED NOT DETECTED Final   Mycoplasma pneumoniae NOT DETECTED NOT DETECTED Final    Comment: Performed at [facility], [address] [place], [place] [address]  Culture, sputum-assessment     Status: None   Collection Time: 06/08/18  2:49 AM  Result Value Ref Range Status   Specimen Description SPUTUM  Final   Special Requests NONE  Final   Sputum evaluation   Final    THIS SPECIMEN IS ACCEPTABLE FOR SPUTUM CULTURE Performed at [facility], [address] [place], [place] [address]    Report Status 06/08/2018 FINAL  Final  Culture, respiratory     Status: None   Collection Time: 06/08/18  2:49 AM  Result Value Ref Range Status   Specimen Description   Final    SPUTUM Performed at [facility], [address], [place], [place] [address]    Special Requests   Final    NONE Reflexed from [id number] Performed at [facility], [address] [place], [place] [address]    Gram Stain   Final    FEW WBC PRESENT, PREDOMINANTLY PMN RARE GRAM NEGATIVE RODS    Culture   Final    Consistent with normal respiratory flora. Performed at [facility], [address] [place], [place] [address]    Report Status 06/10/2018 FINAL  Final     Labs: BNP (last 3 results) No results for input(s): BNP in the last 8760 hours. Basic Metabolic Panel: Recent Labs  Lab 06/07/18 1947 06/08/18 0307 06/09/18 0505 06/10/18 0419  NA 135 137 136 138  K 4.0 3.9 3.3* 3.8  CL 97* 101 99 101  CO2 28 27 29 30   GLUCOSE 136* 244* 177* 140*  BUN 29* 24* 20 18  CREATININE 1.17 0.98 0.94 0.95  CALCIUM 9.5 8.8* 8.3* 8.7*  MG  --  1.5* 1.6* 1.9   Liver Function  Tests: Recent Labs  Lab 06/07/18 1947 06/08/18 0307  AST 29 23  ALT 20 18  ALKPHOS 66 57  BILITOT 1.0 0.8  PROT 7.7 6.8  ALBUMIN 3.8 3.3*   No results for input(s): LIPASE, AMYLASE in the last 168 hours. No results for input(s): AMMONIA in the last 168 hours. CBC: Recent Labs  Lab 06/07/18 1947 06/08/18 0307 06/09/18 0505 06/10/18 0419  WBC 13.8* 13.1* 13.2* 15.6*  NEUTROABS  --  9.5*  --   --   HGB 13.1 11.4* 11.1* 11.6*  HCT 42.6 37.2* 36.6* 39.0  MCV 95.7 97.4 96.8 98.7  PLT 206 188 211 234   Cardiac Enzymes: Recent Labs  Lab 06/07/18 1947 06/08/18 0307  TROPONINI <0.03 0.03*   BNP: Invalid input(s): POCBNP CBG: Recent Labs  Lab 06/09/18 1634 06/09/18 2049 06/10/18 0736 06/10/18 0840 06/10/18 1455  GLUCAP 241* 270* 67* 87 205*   D-Dimer No results for input(s): DDIMER in the last 72 hours. Hgb A1c Recent Labs    06/08/18 0957  HGBA1C 7.1*   Lipid Profile No results for input(s): CHOL, HDL, LDLCALC, TRIG, CHOLHDL, LDLDIRECT in the last 72 hours. Thyroid function studies Recent Labs    06/08/18 0307  TSH 2.205   Anemia work up No results for input(s): VITAMINB12, FOLATE, FERRITIN, TIBC, IRON, RETICCTPCT in the last 72 hours. Urinalysis    Component Value Date/Time   COLORURINE YELLOW 06/07/2018 1947   APPEARANCEUR CLEAR 06/07/2018 1947   LABSPEC 1.010 06/07/2018 1947   PHURINE 6.0 06/07/2018 1947   GLUCOSEU 250 (A) 06/07/2018 1947   HGBUR TRACE (A) 06/07/2018 1947   BILIRUBINUR NEGATIVE 06/07/2018 1947   KETONESUR NEGATIVE 06/07/2018 1947   PROTEINUR NEGATIVE 06/07/2018 1947   NITRITE NEGATIVE 06/07/2018 1947   LEUKOCYTESUR NEGATIVE 06/07/2018 1947   Sepsis Labs Invalid input(s): PROCALCITONIN,  WBC,  LACTICIDVEN Microbiology Recent Results (from the past 240 hour(s))  Urine C&S     Status: None   Collection Time: 06/07/18  7:47 PM  Result Value Ref Range Status   Specimen Description   Final    URINE, CATHETERIZED Performed at  [facility], [address] [place], [place] [address]    Special Requests   Final    NONE Performed at [facility], [address] [place], [place] [address]    Culture   Final    NO GROWTH Performed at [facility], [address], [place], [place] [address]    Report Status 06/09/2018 FINAL  Final  Blood Culture (routine x 2)     Status: None (Preliminary result)   Collection Time: 06/07/18  7:50 PM  Result Value Ref Range Status   Specimen Description BLOOD BLOOD LEFT FOREARM  Final   Special Requests   Final    BOTTLES DRAWN AEROBIC AND ANAEROBIC Blood Culture adequate volume Performed at [facility], [address] [place], [place] [address]    Culture NO GROWTH 3 DAYS  Final   Report Status PENDING  Incomplete  Blood Culture (routine x 2)     Status: None (Preliminary result)   Collection Time: 06/07/18  7:55 PM  Result Value Ref Range Status   Specimen Description BLOOD BLOOD RIGHT FOREARM  Final   Special Requests   Final    BOTTLES DRAWN AEROBIC AND ANAEROBIC Blood Culture adequate volume Performed  at [facility], [address] [place], [place] [address]    Culture NO GROWTH 3 DAYS  Final   Report Status PENDING  Incomplete  Respiratory Panel by PCR     Status: None   Collection Time: 06/08/18  1:42 AM  Result Value Ref Range Status   Adenovirus NOT DETECTED NOT DETECTED Final   Coronavirus 229E NOT DETECTED NOT DETECTED Final    Comment: (NOTE) The Coronavirus on the Respiratory Panel, DOES NOT test for the novel  Coronavirus (2019 nCoV)    Coronavirus HKU1 NOT DETECTED NOT DETECTED Final   Coronavirus NL63 NOT DETECTED NOT DETECTED Final   Coronavirus OC43 NOT DETECTED NOT DETECTED Final   Metapneumovirus NOT DETECTED NOT DETECTED Final   Rhinovirus / Enterovirus NOT DETECTED NOT DETECTED Final   Influenza A NOT DETECTED NOT DETECTED Final   Influenza B NOT DETECTED NOT DETECTED Final   Parainfluenza  Virus 1 NOT DETECTED NOT DETECTED Final   Parainfluenza Virus 2 NOT DETECTED NOT DETECTED Final   Parainfluenza Virus 3 NOT DETECTED NOT DETECTED Final   Parainfluenza Virus 4 NOT DETECTED NOT DETECTED Final   Respiratory Syncytial Virus NOT DETECTED NOT DETECTED Final   Bordetella pertussis NOT DETECTED NOT DETECTED Final   Chlamydophila pneumoniae NOT DETECTED NOT DETECTED Final   Mycoplasma pneumoniae NOT DETECTED NOT DETECTED Final    Comment: Performed at [facility], [address] [place], [place] [address]  Culture, sputum-assessment     Status: None   Collection Time: 06/08/18  2:49 AM  Result Value Ref Range Status   Specimen Description SPUTUM  Final   Special Requests NONE  Final   Sputum evaluation   Final    THIS SPECIMEN IS ACCEPTABLE FOR SPUTUM CULTURE Performed at [facility], [address] [place], [place] [address]    Report Status 06/08/2018 FINAL  Final  Culture, respiratory     Status: None   Collection Time: 06/08/18  2:49 AM  Result Value Ref Range Status   Specimen Description   Final    SPUTUM Performed at [facility], [address] [place], [place] [address]    Special Requests   Final    NONE Reflexed from [id number] Performed at [facility], [address], [place], [place] [address]    Gram Stain   Final    FEW WBC PRESENT, PREDOMINANTLY PMN RARE GRAM NEGATIVE RODS    Culture   Final    Consistent with normal respiratory flora. Performed at [facility], [address], [place], [place] [address]    Report Status 06/10/2018 FINAL  Final   Time spent:  SIGNED:   Rickey Barbara, MD  [facility] 06/10/2018, 3:23 PM  If 7PM-7AM, please contact night-coverage

## 2018-06-10 NOTE — Progress Notes (Signed)
Inpatient Diabetes Program Recommendations  AACE/ADA: New Consensus Statement on Inpatient Glycemic Control (2015)  Target Ranges:  Prepandial:   less than 140 mg/dL      Peak postprandial:   less than 180 mg/dL (1-2 hours)      Critically ill patients:  140 - 180 mg/dL   Lab Results  Component Value Date   GLUCAP 205 (H) 06/10/2018   HGBA1C 7.1 (H) 06/08/2018    Review of Glycemic Control  Diabetes history: DM2 Outpatient Diabetes medications: Levemir 22 units bid, Humalog 26 units tidwc, metformin 1000 mg QD, Januvia 100 mg QD Current orders for Inpatient glycemic control:  Levemir 10 units bid, Novolog 0-9 units tidwc  HgbA1C - 7.1% Admitted to having lows at home. States he takes a little bit of Humalog at HS sometimes. States "my doctor gets on me for taking Humalog at bedtime."  Inpatient Diabetes Program Recommendations:     Levemir 15 units bid Humalog 6 units tidwc Januvia 100 mg QD  Follow-up with PCP managing his diabetes in next 1-2 weeks.  Thank you. Ailene Ards, RD, LDN, CDE Inpatient Diabetes Coordinator [phone]

## 2018-06-12 LAB — CULTURE, BLOOD (ROUTINE X 2)
Culture: NO GROWTH
Culture: NO GROWTH
Special Requests: ADEQUATE
Special Requests: ADEQUATE

## 2019-11-09 DEATH — deceased

## 2020-02-03 IMAGING — CT CT HEAD W/O CM
3 of 6 series · 13 of 47 positions shown, 15 images · non-contrast
Comparison: None.

CLINICAL DATA: Initial evaluation for acute altered mental status.

EXAM:
CT HEAD WITHOUT CONTRAST
TECHNIQUE: Contiguous axial images were obtained from the base of the skull
through the vertex without intravenous contrast.

[Series 2: head wo · axial · 0.47mm/px · z∈[-105,+25]mm · 8 of 34 slices shown, 10 images]
[im 4/34  brain]
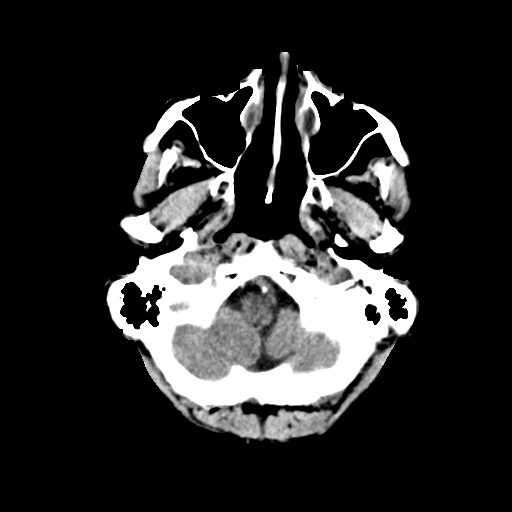
[im 4/34  bone]
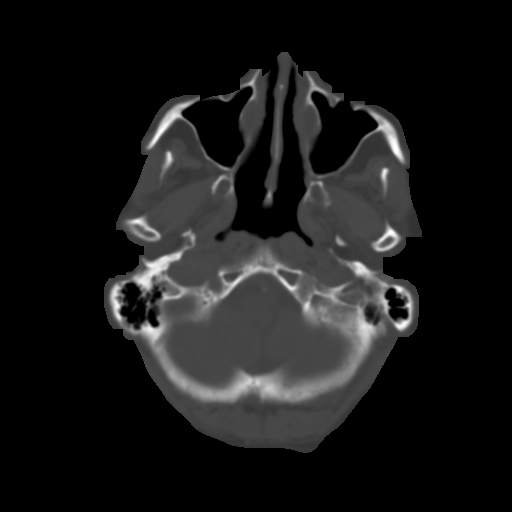
[im 7/34  brain]
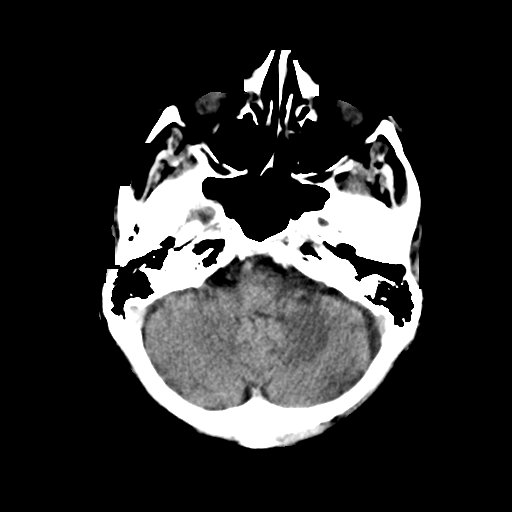
[im 11/34  brain]
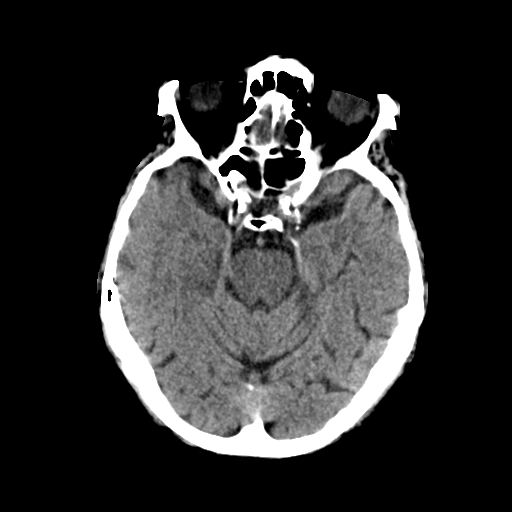
[im 14/34  brain]
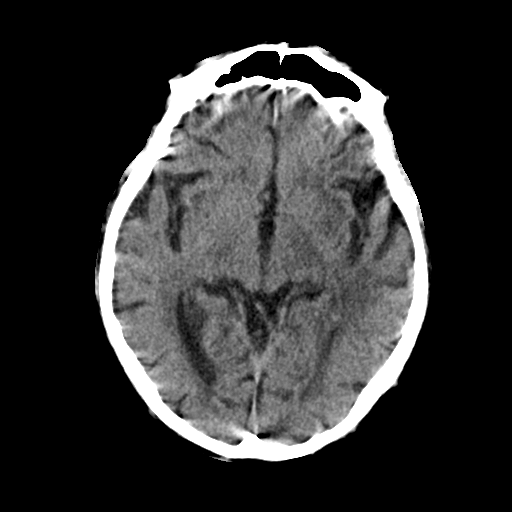
[im 20/34  brain]
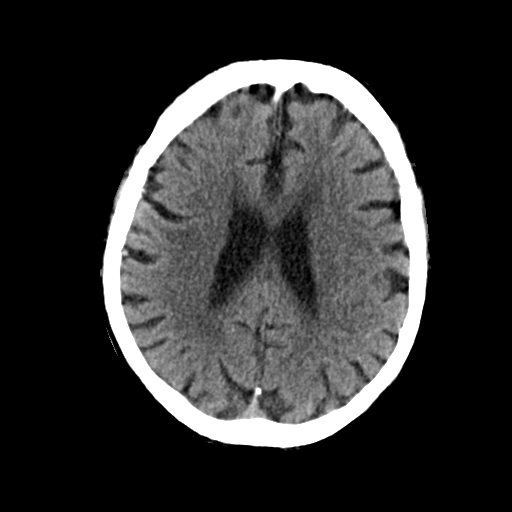
[im 20/34  bone]
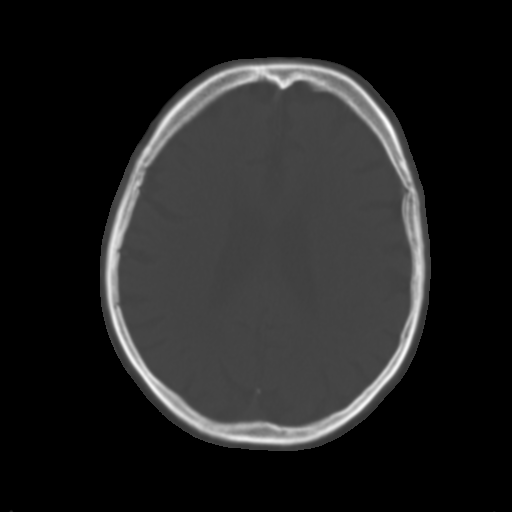
[im 23/34  brain]
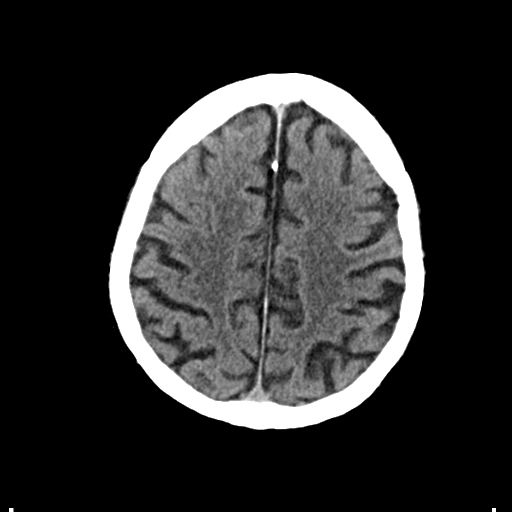
[im 27/34  brain]
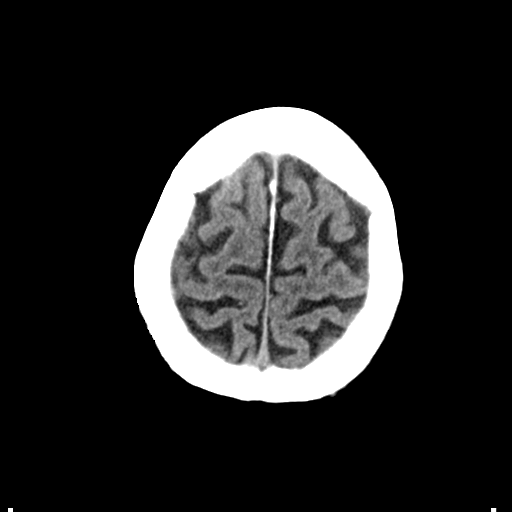
[im 30/34  brain]
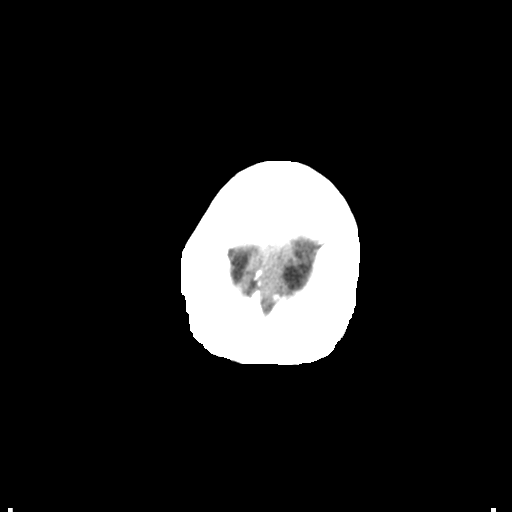

[Series 6: coronal soft tissue · coronal · 0.34mm/px · 3 of 67 slices shown]
[im 17/67  brain]
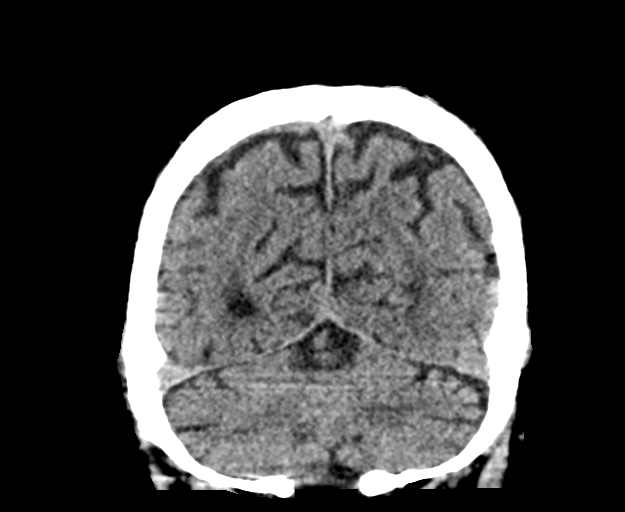
[im 34/67  brain]
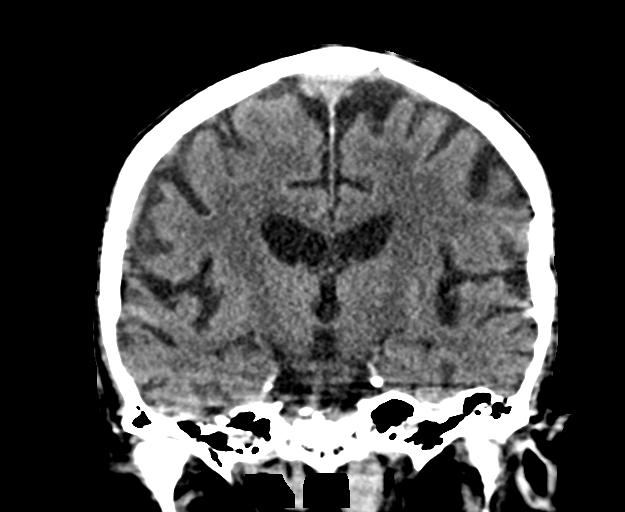
[im 50/67  brain]
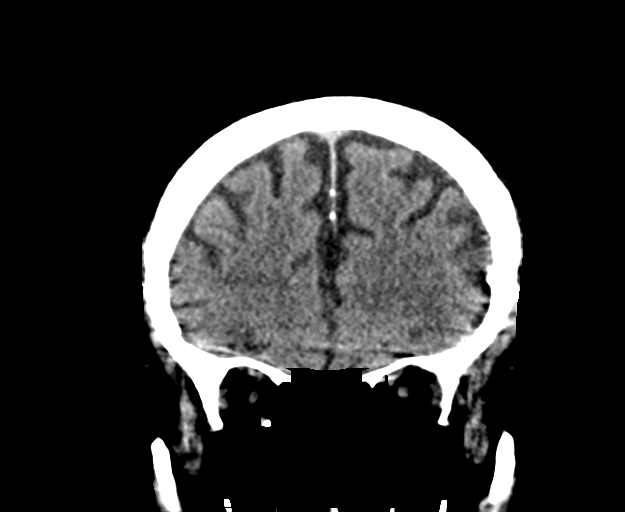

[Series 9: sagittal soft tissue · sagittal · 0.20mm/px · 2 of 67 slices shown]
[im 23/67  brain]
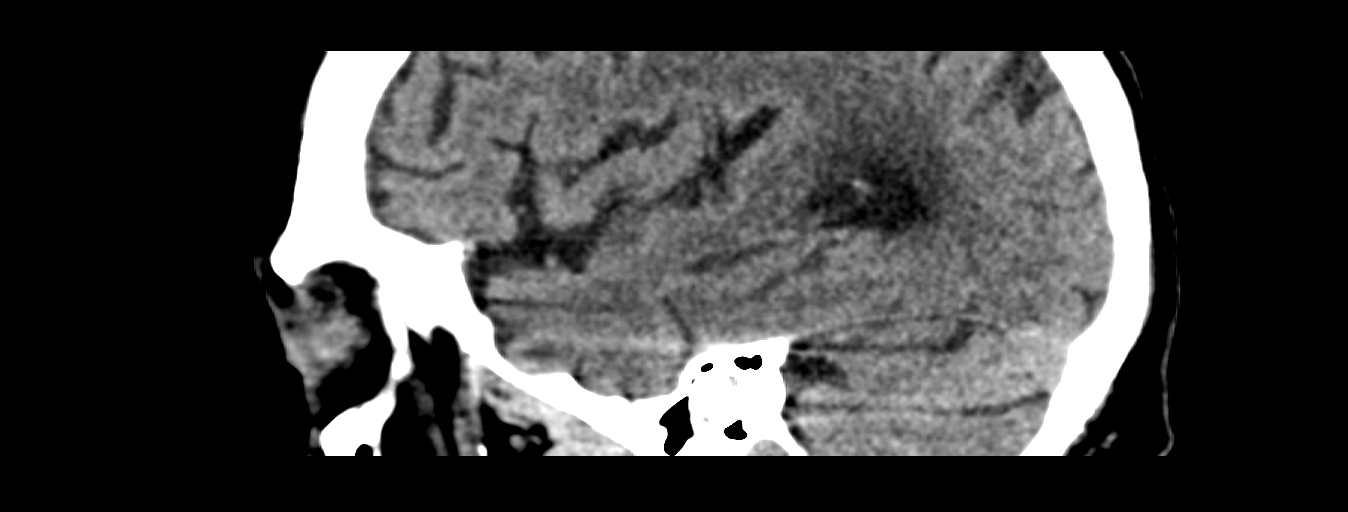
[im 45/67  brain]
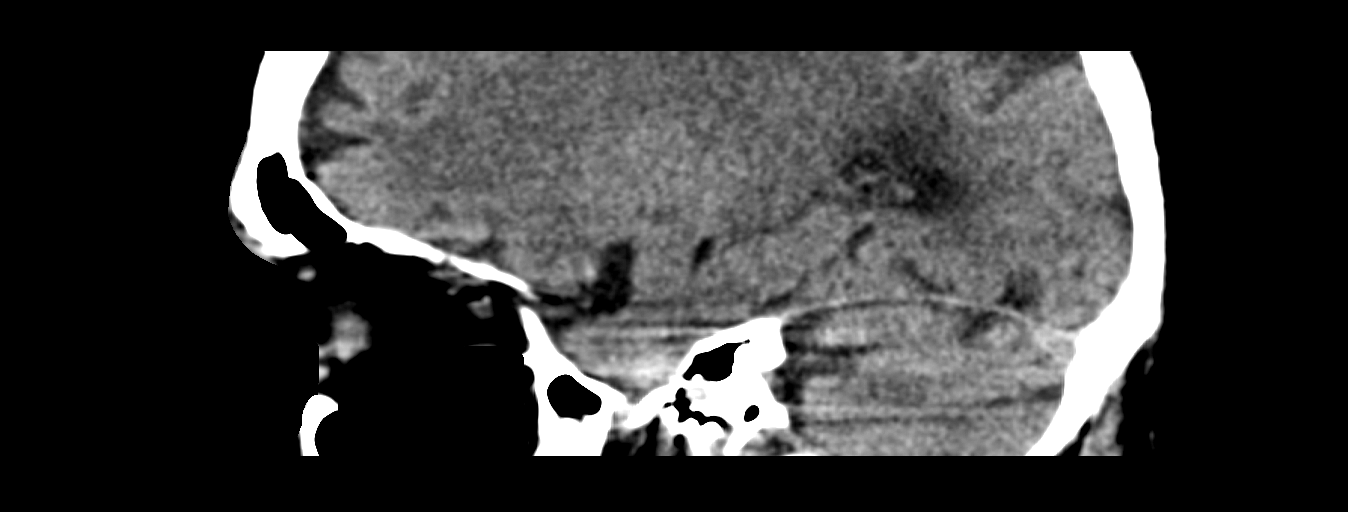

[13 of 47 positions shown; findings below may reference images not displayed]

FINDINGS: Brain: Examination degraded by motion artifact.

Age-related cerebral atrophy with mild chronic small vessel ischemic
disease. No acute intracranial hemorrhage. No acute large vessel
territory infarct. No mass lesion, midline shift or mass effect. No
hydrocephalus. No extra-axial fluid collection.

Vascular: No hyperdense vessel. Scattered vascular calcifications
noted within the carotid siphons.

Skull: Scalp soft tissues demonstrate no acute finding. Calvarium
intact.

Sinuses/Orbits: Globes and orbital soft tissues within normal
limits. Mucosal thickening present within the ethmoidal air cells
and maxillary sinuses, chronic in appearance. No mastoid effusion.

Other: None.
IMPRESSION: 1. No acute intracranial abnormality.
2. Age-related cerebral atrophy with mild chronic small vessel
ischemic disease.
